# Patient Record
Sex: Female | Born: 1975 | Race: Black or African American | Hispanic: No | Marital: Single | Smoking: Never smoker
Health system: Southern US, Community
[De-identification: ages and names within clinical notes are randomized; demographics above are authoritative.]

## PROBLEM LIST (undated history)

## (undated) DIAGNOSIS — T7840XA Allergy, unspecified, initial encounter: Secondary | ICD-10-CM

## (undated) DIAGNOSIS — I10 Essential (primary) hypertension: Secondary | ICD-10-CM

## (undated) DIAGNOSIS — H409 Unspecified glaucoma: Secondary | ICD-10-CM

## (undated) HISTORY — DX: Allergy, unspecified, initial encounter: T78.40XA

## (undated) HISTORY — DX: Essential (primary) hypertension: I10

## (undated) HISTORY — DX: Unspecified glaucoma: H40.9

---

## 1998-02-10 ENCOUNTER — Other Ambulatory Visit: Admission: RE | Admit: 1998-02-10 | Discharge: 1998-02-10 | Payer: Self-pay | Admitting: Obstetrics and Gynecology

## 1999-06-18 ENCOUNTER — Other Ambulatory Visit: Admission: RE | Admit: 1999-06-18 | Discharge: 1999-06-18 | Payer: Self-pay | Admitting: Obstetrics and Gynecology

## 2000-07-19 ENCOUNTER — Other Ambulatory Visit: Admission: RE | Admit: 2000-07-19 | Discharge: 2000-07-19 | Payer: Self-pay | Admitting: Obstetrics and Gynecology

## 2001-08-05 ENCOUNTER — Emergency Department (HOSPITAL_COMMUNITY): Admission: EM | Admit: 2001-08-05 | Discharge: 2001-08-05 | Payer: Self-pay | Admitting: Emergency Medicine

## 2002-02-04 ENCOUNTER — Other Ambulatory Visit: Admission: RE | Admit: 2002-02-04 | Discharge: 2002-02-04 | Payer: Self-pay | Admitting: Obstetrics and Gynecology

## 2003-02-10 ENCOUNTER — Other Ambulatory Visit: Admission: RE | Admit: 2003-02-10 | Discharge: 2003-02-10 | Payer: Self-pay | Admitting: Obstetrics and Gynecology

## 2003-08-27 ENCOUNTER — Emergency Department (HOSPITAL_COMMUNITY): Admission: EM | Admit: 2003-08-27 | Discharge: 2003-08-27 | Payer: Self-pay | Admitting: Emergency Medicine

## 2004-03-05 ENCOUNTER — Ambulatory Visit (HOSPITAL_COMMUNITY): Admission: RE | Admit: 2004-03-05 | Discharge: 2004-03-05 | Payer: Self-pay | Admitting: General Surgery

## 2004-10-13 ENCOUNTER — Emergency Department (HOSPITAL_COMMUNITY): Admission: EM | Admit: 2004-10-13 | Discharge: 2004-10-13 | Payer: Self-pay | Admitting: Emergency Medicine

## 2004-12-18 ENCOUNTER — Emergency Department (HOSPITAL_COMMUNITY): Admission: EM | Admit: 2004-12-18 | Discharge: 2004-12-18 | Payer: Self-pay | Admitting: Emergency Medicine

## 2008-09-22 ENCOUNTER — Inpatient Hospital Stay (HOSPITAL_COMMUNITY): Admission: AD | Admit: 2008-09-22 | Discharge: 2008-09-22 | Payer: Self-pay | Admitting: Obstetrics

## 2008-11-12 ENCOUNTER — Ambulatory Visit (HOSPITAL_COMMUNITY): Admission: RE | Admit: 2008-11-12 | Discharge: 2008-11-12 | Payer: Self-pay | Admitting: Obstetrics & Gynecology

## 2008-11-20 ENCOUNTER — Ambulatory Visit (HOSPITAL_COMMUNITY): Admission: RE | Admit: 2008-11-20 | Discharge: 2008-11-20 | Payer: Self-pay | Admitting: Obstetrics & Gynecology

## 2008-12-23 ENCOUNTER — Ambulatory Visit (HOSPITAL_COMMUNITY): Admission: RE | Admit: 2008-12-23 | Discharge: 2008-12-23 | Payer: Self-pay | Admitting: Obstetrics & Gynecology

## 2009-01-16 ENCOUNTER — Inpatient Hospital Stay (HOSPITAL_COMMUNITY): Admission: AD | Admit: 2009-01-16 | Discharge: 2009-02-16 | Payer: Self-pay | Admitting: Obstetrics

## 2009-01-16 ENCOUNTER — Encounter: Payer: Self-pay | Admitting: Obstetrics & Gynecology

## 2009-01-21 ENCOUNTER — Encounter: Payer: Self-pay | Admitting: Obstetrics

## 2009-02-06 ENCOUNTER — Encounter: Payer: Self-pay | Admitting: Obstetrics

## 2009-02-13 ENCOUNTER — Encounter: Payer: Self-pay | Admitting: Obstetrics & Gynecology

## 2009-05-28 ENCOUNTER — Encounter (INDEPENDENT_AMBULATORY_CARE_PROVIDER_SITE_OTHER): Payer: Self-pay | Admitting: *Deleted

## 2009-06-19 ENCOUNTER — Ambulatory Visit: Payer: Self-pay | Admitting: Gastroenterology

## 2009-06-19 DIAGNOSIS — K625 Hemorrhage of anus and rectum: Secondary | ICD-10-CM | POA: Insufficient documentation

## 2009-06-19 DIAGNOSIS — I10 Essential (primary) hypertension: Secondary | ICD-10-CM | POA: Insufficient documentation

## 2009-07-03 ENCOUNTER — Ambulatory Visit: Payer: Self-pay | Admitting: Gastroenterology

## 2009-07-03 DIAGNOSIS — N39498 Other specified urinary incontinence: Secondary | ICD-10-CM | POA: Insufficient documentation

## 2010-05-17 ENCOUNTER — Encounter: Payer: Self-pay | Admitting: Obstetrics & Gynecology

## 2010-05-27 NOTE — Assessment & Plan Note (Signed)
Summary: diarrhea/blood in stool...as.   History of Present Illness Visit Type: consult Primary GI MD: Sheryn Bison MD FACP FAGA Primary Provider: Lajean Manes, MD Chief Complaint: bowel movements after eating that are sometimes loose, but painful. Pt will notice blood when she wipes.  Pt states she is also having urinary frequency and leaking History of Present Illness:   35 year old African American female 4 months post delivery by cesarean section who has daily bright red blood with wiping but no rectal or abdominal pain. She gives a vague history of previous anemia but is not on iron therapy at this time. She is on daily birth control pills. Family history noncontributory. She denies upper gastrointestinal or hepatobiliary problems. She follows a regular diet and denies lactose intolerance.   GI Review of Systems      Denies abdominal pain, acid reflux, belching, bloating, chest pain, dysphagia with liquids, dysphagia with solids, heartburn, loss of appetite, nausea, vomiting, vomiting blood, weight loss, and  weight gain.      Reports diarrhea, heme positive stool, and  rectal bleeding.     Denies anal fissure, black tarry stools, change in bowel habit, constipation, diverticulosis, fecal incontinence, hemorrhoids, irritable bowel syndrome, jaundice, light color stool, liver problems, and  rectal pain.    Current Medications (verified): 1)  Fruity Chewables Multivitamin  Chew (Pediatric Multiple Vit-C-Fa) .... Chew 2 Vitamins Daily 2)  Camila 0.35 Mg Tabs (Norethindrone) .... Take 1 Tablet By Mouth Once A Day  Allergies (verified): No Known Drug Allergies  Past History:  Past medical, surgical, family and social histories (including risk factors) reviewed for relevance to current acute and chronic problems.  Past Medical History: Hypertension Chronic Headaches  Past Surgical History: Reviewed history from 06/18/2009 and no changes required. C- section   Family  History: Reviewed history and no changes required. No FH of Colon Cancer:  Social History: Reviewed history and no changes required. Single, 1 girl Unemployed Patient has never smoked.  Alcohol Use - no Daily Caffeine Use 3/day Illicit Drug Use - no  Review of Systems       The patient complains of allergy/sinus, nosebleeds, sore throat, thirst - excessive, urination - excessive, urination changes/pain, and urine leakage.  The patient denies anemia, anxiety-new, arthritis/joint pain, back pain, blood in urine, breast changes/lumps, confusion, cough, coughing up blood, depression-new, fainting, fatigue, fever, headaches-new, hearing problems, heart murmur, heart rhythm changes, itching, menstrual pain, muscle pains/cramps, night sweats, pregnancy symptoms, shortness of breath, skin rash, sleeping problems, swelling of feet/legs, swollen lymph glands, vision changes, and voice change.    Vital Signs:  Patient profile:   35 year old female Height:      65 inches Weight:      198 pounds BMI:     33.07 Pulse rate:   64 / minute Pulse rhythm:   regular BP sitting:   130 / 86  (left arm) Cuff size:   regular  Vitals Entered By: Francee Piccolo CMA Duncan Dull) (June 19, 2009 8:25 AM)  Physical Exam  General:  Well developed, well nourished, no acute distress.healthy appearing.   Head:  Normocephalic and atraumatic. Eyes:  PERRLA, no icterus. Neck:  Supple; no masses or thyromegaly. Lungs:  Clear throughout to auscultation. Heart:  Regular rate and rhythm; no murmurs, rubs,  or bruits. Rectal:  Normal exam.hemocult negative.  impacted hard stool in rectal vault. No obviousfissure or fistula noted. Msk:  Symmetrical with no gross deformities. Normal posture. Extremities:  No clubbing, cyanosis, edema or deformities  noted. Neurologic:  Alert and  oriented x4;  grossly normal neurologically. Cervical Nodes:  No significant cervical adenopathy. Psych:  Alert and cooperative. Normal  mood and affect.   Impression & Recommendations:  Problem # 1:  RECTAL BLEEDING (ICD-569.3) Assessment Unchanged Presentation and examination consistent with mild constipation and internal hemorrhoidal bleeding. I placed her on daily Benefiber, high-fiber diet, liberal p.o. fluids, Anusol HC suppositories at bedtime, and we'll check her again in 2 weeks' time in followup. She may need colonoscopy depending on her clinical course.  Problem # 2:  HYPERTENSION, LABILE (ICD-401.9) Assessment: Improved Blood Pressure today is 130/86. She is not on any hypertensive therapy.  Patient Instructions: 1)  Copy sent to : Dr[facility] Coral Ceo at [facility]. 2)  Please continue current medications.  3)  Constipation and Hemorrhoids brochure given.  4)  Please schedule a follow-up appointment in 2 weeks.  5)  High Fiber, Low Fat  Healthy Eating Plan brochure given.  6)  daily Benefiber and liberal p.o. fluids 7)  The medication list was reviewed and reconciled.  All changed / newly prescribed medications were explained.  A complete medication list was provided to the patient / caregiver.  Appended Document: diarrhea/blood in stool...as.    Clinical Lists Changes  Medications: Added new medication of ANUSOL-HC 25 MG  SUPP (HYDROCORTISONE ACETATE) 1 per rectum q hs - Signed Added new medication of BENEFIBER  POWD (WHEAT DEXTRIN) Daily Rx of ANUSOL-HC 25 MG  SUPP (HYDROCORTISONE ACETATE) 1 per rectum q hs;  #24 x 3;  Signed;  Entered by: Ashok Cordia RN;  Authorized by: Mardella Layman MD [facility];  Method used: Electronically to [id number] [address] [place], [place]  [address], Ph: [phone] or [phone], Fax: [phone]    Prescriptions: ANUSOL-HC 25 MG  SUPP (HYDROCORTISONE ACETATE) 1 per rectum q hs  #24 x 3   Entered by:   Ashok Cordia RN   Authorized by:   Mardella Layman MD [facility]   Signed by:   Ashok Cordia RN on 06/19/2009   Method used:   Electronically to         [id number] (retail)       [address]       [place], [place]  [address]       Ph: [phone] or [phone]       Fax: [phone]   RxID:   [id number]

## 2010-05-27 NOTE — Letter (Signed)
Summary: New Patient letter  [facility]  [address] [place], [place] [address]   Phone: [phone]  Fax: [phone]       05/28/2009 MRN: [id number]  Lorraine Rodriguez [address] [place], [place]  [address]  Dear Lorraine Rodriguez,  Welcome to the Gastroenterology Division at [facility].    You are scheduled to see Dr.  Jarold Motto on 06/19/2009 at 8:30AM on the 3rd floor at [facility], [address]  We ask that you try to arrive at our office 15 minutes prior to your appointment time to allow for check-in.  We would like you to complete the enclosed self-administered evaluation form prior to your visit and bring it with you on the day of your appointment.  We will review it with you.  Also, please bring a complete list of all your medications or, if you prefer, bring the medication bottles and we will list them.  Please bring your insurance card so that we may make a copy of it.  If your insurance requires a referral to see a specialist, please bring your referral form from your primary care physician.  Co-payments are due at the time of your visit and may be paid by cash, check or credit card.     Your office visit will consist of a consult with your physician (includes a physical exam), any laboratory testing he/she may order, scheduling of any necessary diagnostic testing (e.g. x-ray, ultrasound, CT-scan), and scheduling of a procedure (e.g. Endoscopy, Colonoscopy) if required.  Please allow enough time on your schedule to allow for any/all of these possibilities.    If you cannot keep your appointment, please call [phone] to cancel or reschedule prior to your appointment date.  This allows [place] the opportunity to schedule an appointment for another patient in need of care.  If you do not cancel or reschedule by 5 p.m. the business day prior to your appointment date, you will be charged a $50.00 late cancellation/no-show fee.    Thank you for  choosing [facility] for your medical needs.  We appreciate the opportunity to care for you.  Please visit [place] at our website  to learn more about our practice.                     Sincerely,                                                             The Gastroenterology Division

## 2010-05-27 NOTE — Assessment & Plan Note (Signed)
Summary: 2-WEEK F/U APPT...LSW.   History of Present Illness Visit Type: Follow-up Visit Primary GI MD: Sheryn Bison MD FACP FAGA Primary Provider: Lajean Manes, MD Requesting Provider: n/a Chief Complaint: F/u for loose stools and blood when pt wipes after BMs. Pt states the stools have gotten better and there is no more  blood. Pt still having urine leakage  History of Present Illness:   She currently is asymptomatic except for some gas and bloating. Her constipation is improved with Metamucil wafers. She denies other GI complaints. She does have some mild urinary incontinency that has not been evaluated to date.   GI Review of Systems      Denies abdominal pain, acid reflux, belching, bloating, chest pain, dysphagia with liquids, dysphagia with solids, heartburn, loss of appetite, nausea, vomiting, vomiting blood, weight loss, and  weight gain.        Denies anal fissure, black tarry stools, change in bowel habit, constipation, diarrhea, diverticulosis, fecal incontinence, heme positive stool, hemorrhoids, irritable bowel syndrome, jaundice, light color stool, liver problems, rectal bleeding, and  rectal pain.        Current Medications (verified): 1)  Fruity Chewables Multivitamin  Chew (Pediatric Multiple Vit-C-Fa) .... Chew 2 Vitamins Daily 2)  Camila 0.35 Mg Tabs (Norethindrone) .... Take 1 Tablet By Mouth Once A Day 3)  Anusol-Hc 25 Mg  Supp (Hydrocortisone Acetate) .Marland Kitchen.. 1 Per Rectum Q Hs 4)  Metamucil  Wafr (Psyllium) .... Two Daily  Allergies (verified): No Known Drug Allergies  Past History:  Past medical, surgical, family and social histories (including risk factors) reviewed for relevance to current acute and chronic problems.  Past Medical History: Reviewed history from 06/19/2009 and no changes required. Hypertension Chronic Headaches  Past Surgical History: Reviewed history from 06/18/2009 and no changes required. C- section  Family  History: Reviewed history from 06/19/2009 and no changes required. No FH of Colon Cancer:  Social History: Reviewed history from 06/19/2009 and no changes required. Single, 1 girl Unemployed Patient has never smoked.  Alcohol Use - no Daily Caffeine Use 3/day Illicit Drug Use - no  Review of Systems       The patient complains of urine leakage.  The patient denies allergy/sinus, anemia, anxiety-new, arthritis/joint pain, back pain, blood in urine, breast changes/lumps, change in vision, confusion, cough, coughing up blood, depression-new, fainting, fatigue, fever, headaches-new, hearing problems, heart murmur, heart rhythm changes, itching, menstrual pain, muscle pains/cramps, night sweats, nosebleeds, pregnancy symptoms, shortness of breath, skin rash, sleeping problems, sore throat, swelling of feet/legs, swollen lymph glands, thirst - excessive , urination - excessive , urination changes/pain, vision changes, and voice change.   GU:  Complains of urinary incontinence; denies urinary burning, blood in urine, nocturnal urination, urinary frequency, abnormal vaginal bleeding, amenorrhea, menorrhagia, vaginal discharge, pelvic pain, genital sores, painful intercourse, and decreased libido.  Vital Signs:  Patient profile:   35 year old female Height:      65 inches Weight:      200 pounds BMI:     33.40 BSA:     1.98 Pulse rate:   62 / minute Pulse rhythm:   regular BP sitting:   124 / 76  (left arm) Cuff size:   regular  Vitals Entered By: Ok Anis CMA (July 03, 2009 9:00 AM)  Physical Exam  General:  Well developed, well nourished, no acute distress.healthy appearing.   Head:  Normocephalic and atraumatic. Eyes:  PERRLA, no icterus.exam deferred to patient's ophthalmologist.   Psych:  Alert  and cooperative. Normal mood and affect.   Impression & Recommendations:  Problem # 1:  RECTAL BLEEDING (ICD-569.3) Assessment Improved Continue constipation regime I will have her  try [employer]' mColon Health b.i.d. as tolerated. Should rectal bleeding return,she is to notify us and we'll proceed with colonoscopy exam.  Problem # 2:  OTHER URINARY INCONTINENCE (ICD-788.39) Assessment: Unchanged She has been advised to notify her gynecologist for appropriate followup and perhaps urologic referral.  Patient Instructions: 1)  Copy sent to : Dr. Lajean Manes and Dr. Harrell GaveChristell Constant in gynecology 2)  Please continue current medications.  3)  Constipation and Hemorrhoids brochure given.  4)  Please schedule a follow-up appointment as needed.  5)  The medication list was reviewed and reconciled.  All changed / newly prescribed medications were explained.  A complete medication list was provided to the patient / caregiver.  Appended Document: 2-WEEK F/U APPT...LSW.    Clinical Lists Changes  Medications: Added new medication of PHILLIPS COLON HEALTH  CAPS (PROBIOTIC PRODUCT) two times a day

## 2010-07-29 LAB — COMPREHENSIVE METABOLIC PANEL
AST: 20 U/L (ref 0–37)
Albumin: 2.8 g/dL — ABNORMAL LOW (ref 3.5–5.2)
Alkaline Phosphatase: 76 U/L (ref 39–117)
BUN: 8 mg/dL (ref 6–23)
Calcium: 9.2 mg/dL (ref 8.4–10.5)
Creatinine, Ser: 0.73 mg/dL (ref 0.4–1.2)
GFR calc non Af Amer: >60 mL/min (ref >60)
Glucose, Bld: 89 mg/dL (ref 70–99)
Potassium: 3.8 mEq/L (ref 3.5–5.1)
Total Protein: 6.6 g/dL (ref 6.0–8.3)

## 2010-07-29 LAB — URINE CULTURE
Colony Count: >=100000
Special Requests: NEGATIVE

## 2010-07-29 LAB — URINALYSIS, ROUTINE W REFLEX MICROSCOPIC
Glucose, UA: NEGATIVE mg/dL
Ketones, ur: NEGATIVE mg/dL
Protein, ur: 30 mg/dL — AB

## 2010-07-29 LAB — CBC
Hemoglobin: 12.7 g/dL (ref 12.0–15.0)
Hemoglobin: 13.1 g/dL (ref 12.0–15.0)
MCHC: 32.9 g/dL (ref 30.0–36.0)
MCHC: 33.4 g/dL (ref 30.0–36.0)
MCV: 88.9 fL (ref 78.0–100.0)
MCV: 89 fL (ref 78.0–100.0)
MCV: 89.3 fL (ref 78.0–100.0)
Platelets: 222 10*3/uL (ref 150–400)
Platelets: 267 10*3/uL (ref 150–400)
RBC: 4.03 MIL/uL (ref 3.87–5.11)
RBC: 4.34 MIL/uL (ref 3.87–5.11)
RDW: 15.1 % (ref 11.5–15.5)
RDW: 15.4 % (ref 11.5–15.5)
RDW: 15.4 % (ref 11.5–15.5)
WBC: 12.5 10*3/uL — ABNORMAL HIGH (ref 4.0–10.5)

## 2010-07-29 LAB — TYPE AND SCREEN
ABO/RH(D): A POS
Antibody Screen: NEGATIVE

## 2010-07-29 LAB — CREATININE CLEARANCE, URINE, 24 HOUR
Collection Interval-CRCL: 24 hours
Creatinine, 24H Ur: 1835 mg/d — ABNORMAL HIGH (ref 700–1800)
Creatinine: 0.73 mg/dL (ref 0.40–1.20)

## 2010-07-29 LAB — PROTEIN, URINE, 24 HOUR
Protein, 24H Urine: 146 mg/d — ABNORMAL HIGH (ref 50–100)
Protein, Urine: 11 mg/dL
Urine Total Volume-UPROT: 1325 mL

## 2010-07-29 LAB — BASIC METABOLIC PANEL
CO2: 22 mEq/L (ref 19–32)
Calcium: 8.4 mg/dL (ref 8.4–10.5)
Creatinine, Ser: 0.97 mg/dL (ref 0.4–1.2)
GFR calc Af Amer: >60 mL/min (ref >60)
GFR calc non Af Amer: >60 mL/min (ref >60)

## 2010-07-29 LAB — TSH: TSH: 0.842 u[IU]/mL (ref 0.350–4.500)

## 2010-07-29 LAB — GLUCOSE TOLERANCE, 1 HOUR: Glucose, 1 Hour GTT: 98 mg/dL (ref 70–140)

## 2010-07-29 LAB — URINE MICROSCOPIC-ADD ON

## 2010-07-29 LAB — CLOSTRIDIUM DIFFICILE EIA

## 2010-07-30 LAB — COMPREHENSIVE METABOLIC PANEL WITH GFR
ALT: 21 U/L (ref 0–35)
AST: 62 U/L — ABNORMAL HIGH (ref 0–37)
Albumin: 3.3 g/dL — ABNORMAL LOW (ref 3.5–5.2)
Alkaline Phosphatase: 65 U/L (ref 39–117)
BUN: 7 mg/dL (ref 6–23)
CO2: 23 meq/L (ref 19–32)
Calcium: 9.7 mg/dL (ref 8.4–10.5)
Chloride: 103 meq/L (ref 96–112)
Creatinine, Ser: 0.76 mg/dL (ref 0.4–1.2)
GFR calc non Af Amer: >60 mL/min
Glucose, Bld: 102 mg/dL — ABNORMAL HIGH (ref 70–99)
Potassium: 5.7 meq/L — ABNORMAL HIGH (ref 3.5–5.1)
Sodium: 133 meq/L — ABNORMAL LOW (ref 135–145)
Total Bilirubin: 1.4 mg/dL — ABNORMAL HIGH (ref 0.3–1.2)
Total Protein: 7 g/dL (ref 6.0–8.3)

## 2010-07-30 LAB — CBC
HCT: 41 % (ref 36.0–46.0)
Hemoglobin: 13.6 g/dL (ref 12.0–15.0)
WBC: 9.4 10*3/uL (ref 4.0–10.5)

## 2010-07-30 LAB — DIFFERENTIAL
Basophils Absolute: 0 10*3/uL (ref 0.0–0.1)
Basophils Relative: 1 % (ref 0–1)
Monocytes Absolute: 0.7 10*3/uL (ref 0.1–1.0)
Neutro Abs: 7 10*3/uL (ref 1.7–7.7)
Neutrophils Relative %: 74 % (ref 43–77)

## 2011-04-28 ENCOUNTER — Other Ambulatory Visit: Payer: Self-pay | Admitting: Obstetrics & Gynecology

## 2012-03-21 ENCOUNTER — Other Ambulatory Visit (HOSPITAL_COMMUNITY): Payer: Self-pay | Admitting: Obstetrics

## 2013-05-25 ENCOUNTER — Ambulatory Visit (INDEPENDENT_AMBULATORY_CARE_PROVIDER_SITE_OTHER): Payer: No Typology Code available for payment source | Admitting: Internal Medicine

## 2013-05-25 VITALS — BP 120/70 | HR 76 | Temp 98.1°F | Resp 16 | Ht 65.25 in | Wt 185.2 lb

## 2013-05-25 DIAGNOSIS — K141 Geographic tongue: Secondary | ICD-10-CM

## 2013-05-25 NOTE — Progress Notes (Signed)
   Subjective:    Patient ID: Lorraine Rodriguez, female    DOB: [birth year], 38 y.o.   MRN: [id number] This chart was scribed for Ellamae Siaobert Kai Calico, MD by Nicholos Johnsenise Iheanachor, Medical Scribe. This patient's care was started at 3:23 PM.  HPI HPI Comments: Lorraine Rodriguez is a 38 y.o. female who presents to the [facility] complaining of bumps on her tonque. Pt states she had pizza with olive toppings from [employer] 10 days ago and the next morning her tongue had developed some bumps in the back near the throat. There is no pain associated with these bumps. States bumps have gone down some but are still present. Pt denies any other sx that developed as a result of eating the pizza and states she has never had this develop before. Pt went to the [facility] but was unable to receive a diagnosis. Denies problems with taste, trouble swallowing, or sore throat. No Fever or nodes.  Review of Systems Noncontributory    Objective:   Physical Exam  Vitals reviewed. Constitutional: She is oriented to person, place, and time. She appears well-developed and well-nourished. No distress.  HENT:  Head: Normocephalic and atraumatic.  Mouth/Throat: Oropharynx is clear and moist.  On the posterior aspect of the tongue there are vallate papillae are prominent without any inflammation or fasciculation The soft and hard palates are clear The posterior pharynx is clear No other oral lesions are present  Eyes: Conjunctivae are normal. Pupils are equal, round, and reactive to light.  Neck: Neck supple.  Lymphadenopathy:    She has no cervical adenopathy.  Neurological: She is alert and oriented to person, place, and time.  Psychiatric: She has a normal mood and affect.   Assessment & Plan:  Pt is advised to just wait out the sx and let them resolve itself.   I have completed the patient encounter in its entirety as documented by the scribe, with editing by me where necessary. Demyah Smyre P. Merla Richesoolittle,  M.D. Geographic tongue  Meds=none needed These papillae should enlarge and decrease in relationship to spicy or salty food intake and should never be associated with any sort of pain or taste dysfunction in their current state

## 2014-11-05 ENCOUNTER — Ambulatory Visit: Payer: Worker's Compensation

## 2014-11-05 ENCOUNTER — Ambulatory Visit (INDEPENDENT_AMBULATORY_CARE_PROVIDER_SITE_OTHER): Payer: Worker's Compensation | Admitting: Emergency Medicine

## 2014-11-05 VITALS — BP 112/78 | HR 84 | Temp 98.5°F | Resp 16 | Ht 65.5 in | Wt 188.8 lb

## 2014-11-05 DIAGNOSIS — S0993XA Unspecified injury of face, initial encounter: Secondary | ICD-10-CM | POA: Diagnosis not present

## 2014-11-05 DIAGNOSIS — M542 Cervicalgia: Secondary | ICD-10-CM

## 2014-11-05 DIAGNOSIS — R519 Headache, unspecified: Secondary | ICD-10-CM

## 2014-11-05 DIAGNOSIS — R51 Headache: Secondary | ICD-10-CM | POA: Diagnosis not present

## 2014-11-05 DIAGNOSIS — N946 Dysmenorrhea, unspecified: Secondary | ICD-10-CM

## 2014-11-05 LAB — POCT URINE PREGNANCY: Preg Test, Ur: NEGATIVE

## 2014-11-05 IMAGING — CR DG FACIAL BONES COMPLETE 3+V
3 series · 3 of 3 positions shown · non-contrast
Comparison: None.

CLINICAL DATA: Recently hit in face with basketball with
right-sided facial pain

EXAM:
FACIAL BONES COMPLETE 3+V

[pa [person_name]]
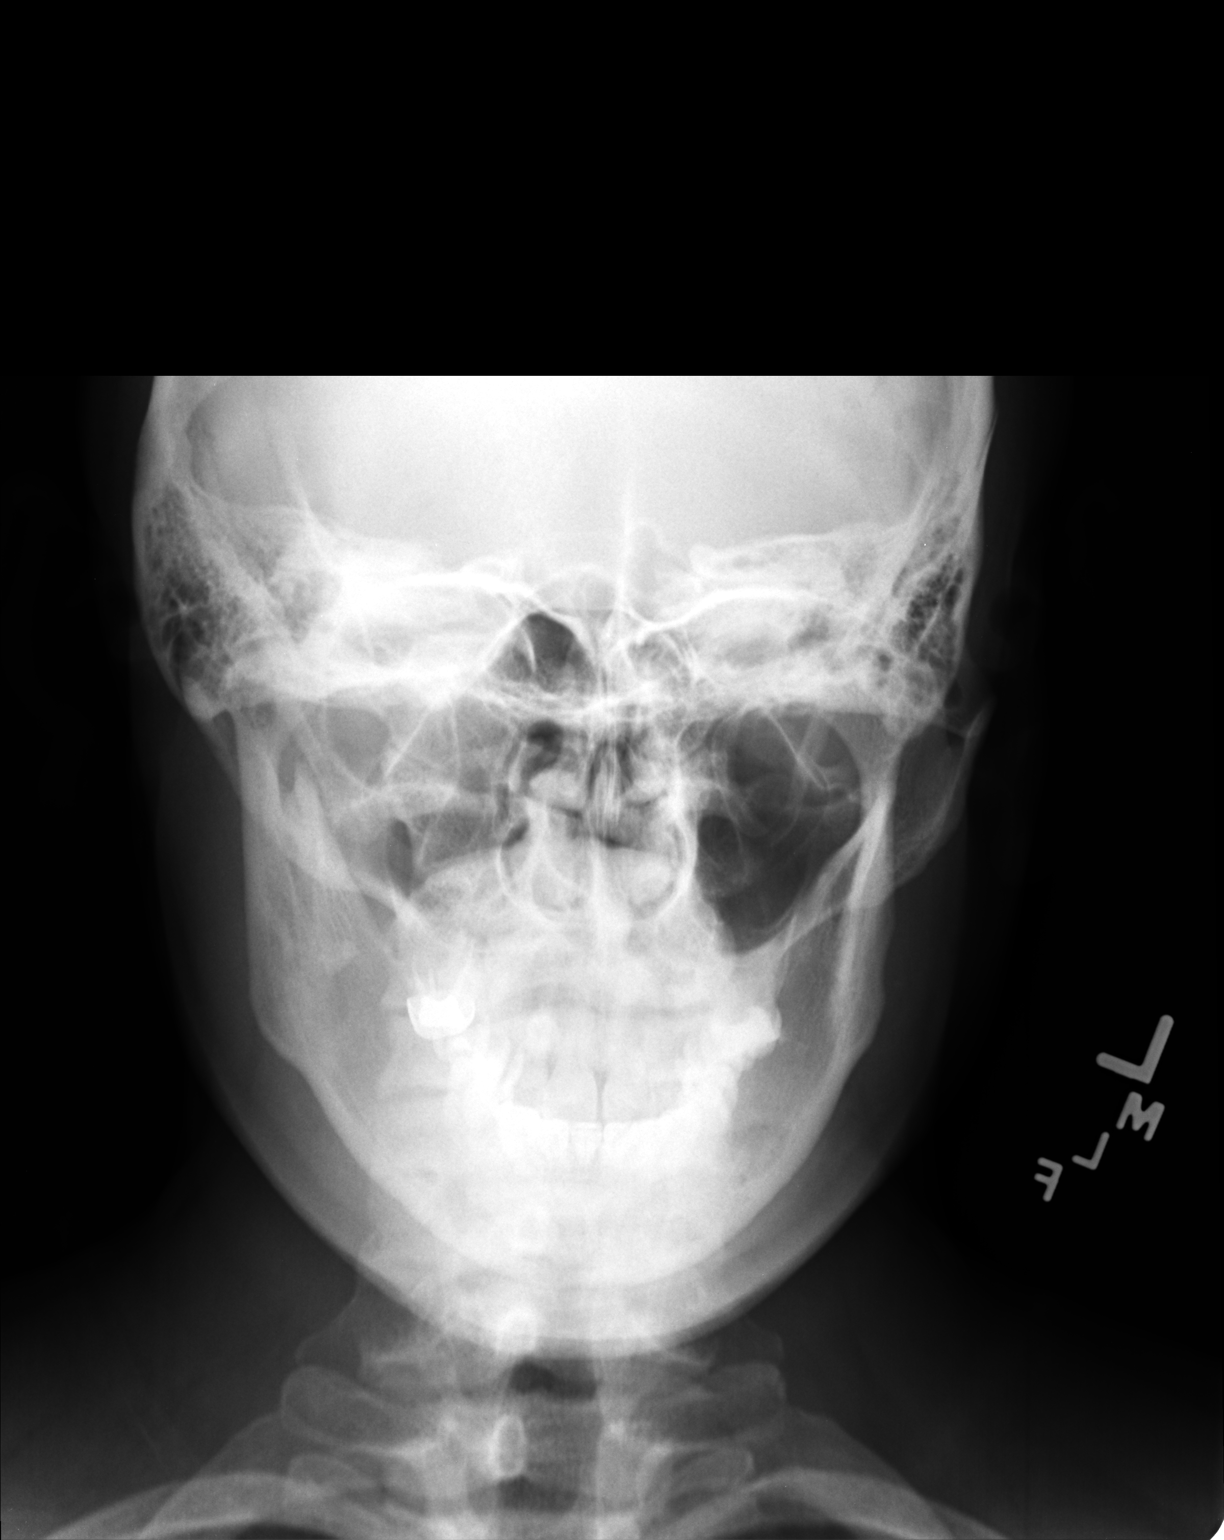

[waters]
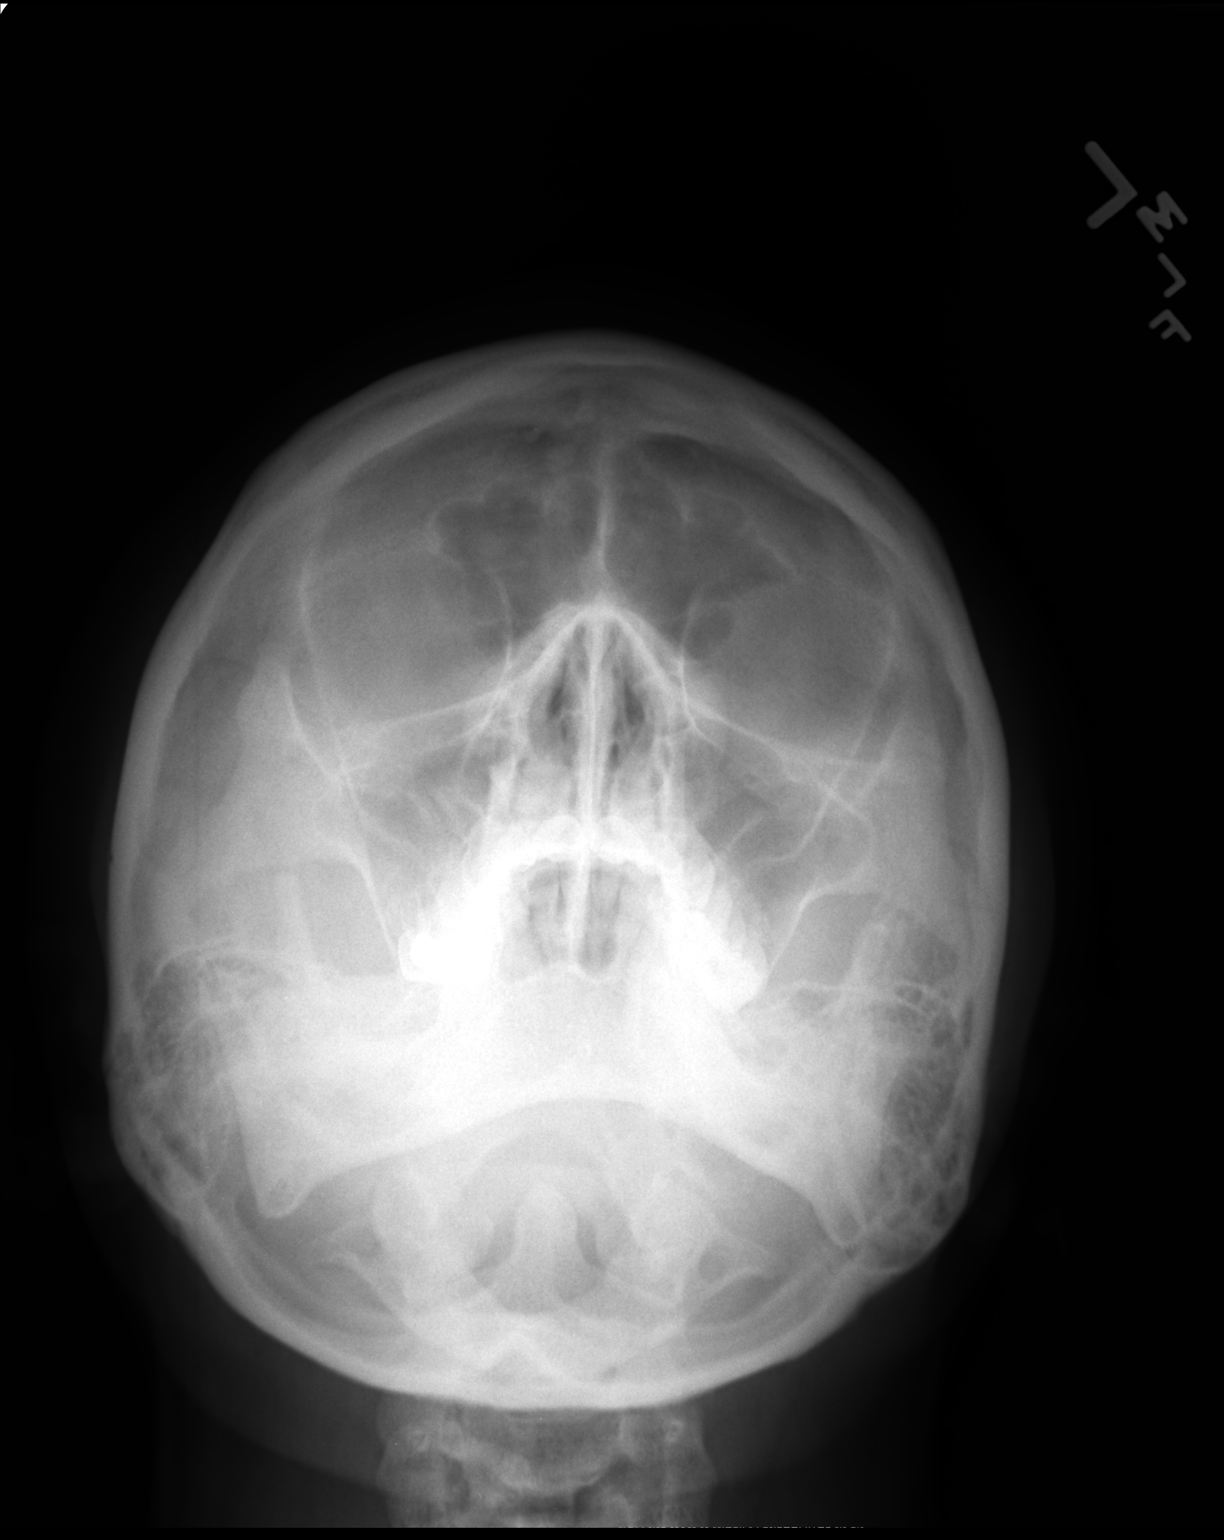

[lateral]
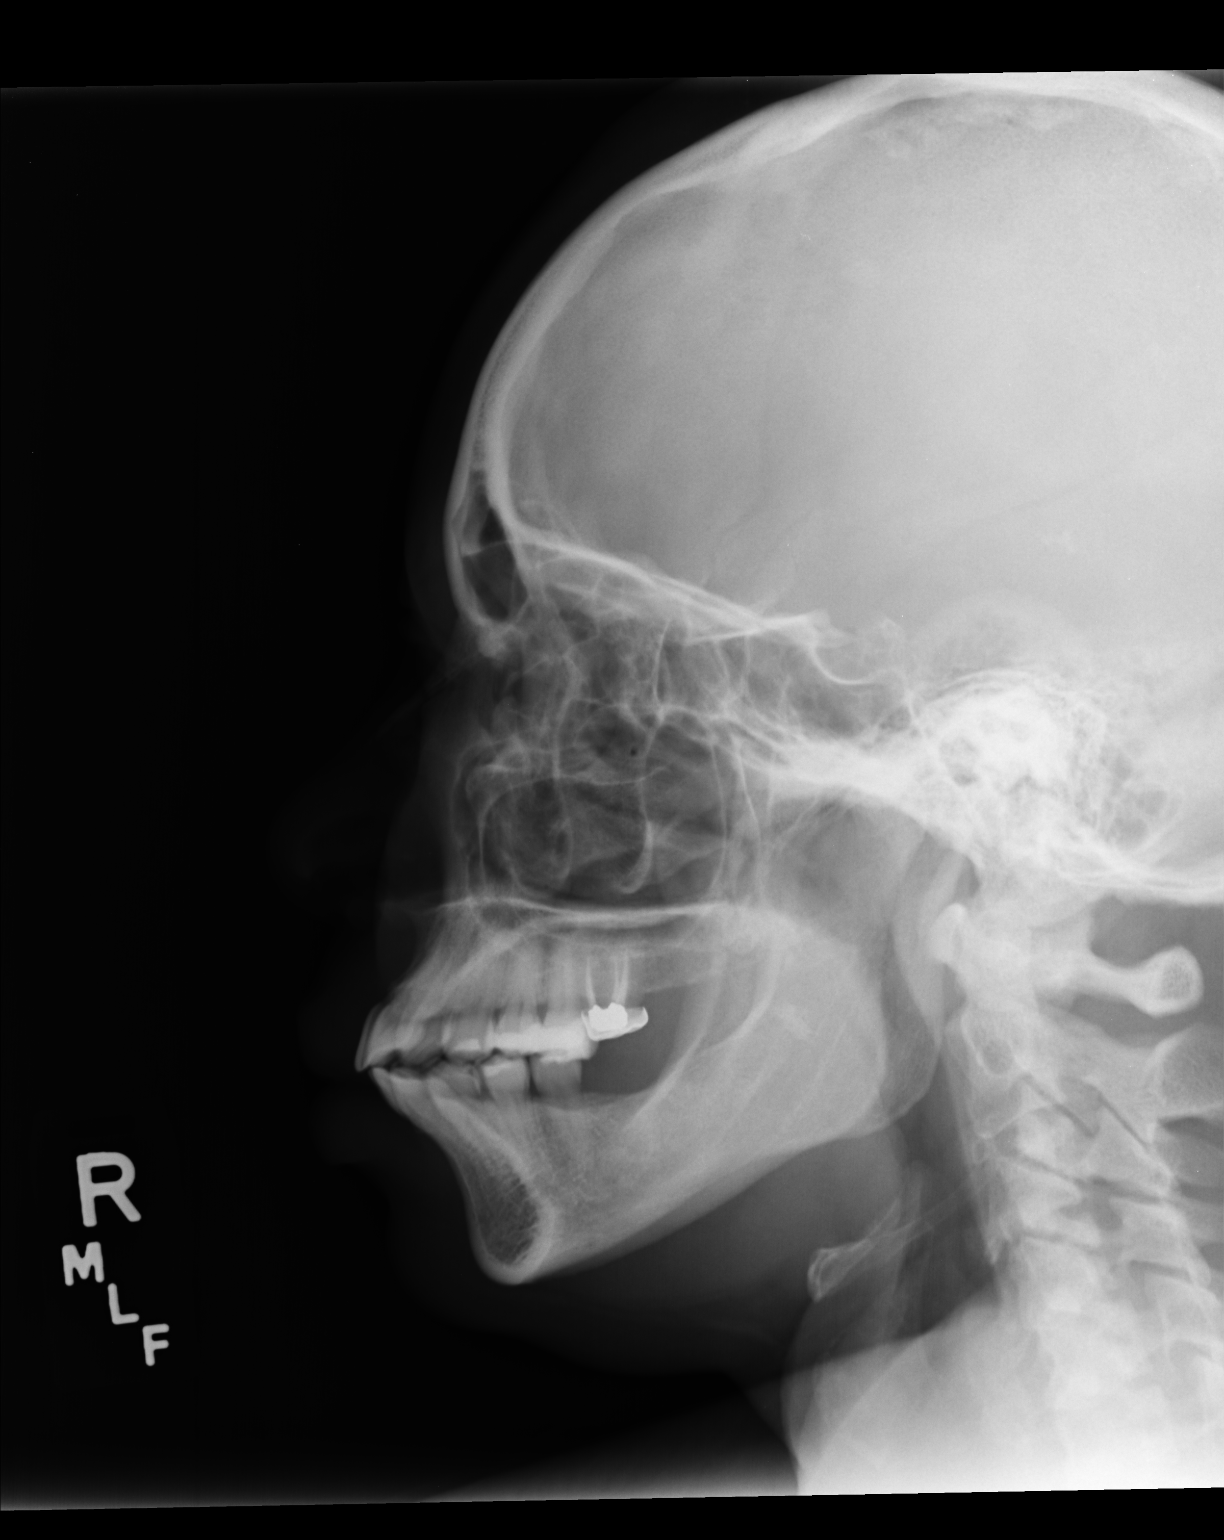

[3 of 3 positions shown; findings below may reference images not displayed]

FINDINGS: There is no evidence of fracture or other significant bone
abnormality. No orbital emphysema or sinus air-fluid levels are
seen.
IMPRESSION: No acute abnormality noted.

## 2014-11-05 MED ORDER — IBUPROFEN 600 MG PO TABS: 600.0000 mg | ORAL_TABLET | Freq: Three times a day (TID) | ORAL | Status: AC | PRN

## 2014-11-05 NOTE — Patient Instructions (Signed)
  GET HELP IF:  It gets even harder for you to pay attention or concentrate.  It gets even harder for you to remember things or learn new things.  You need more time than normal to finish things.  You become annoyed (irritable) more than before.  You are not able to deal with stress as well.  You have more problems than before.  You have problems keeping your balance.  You are not able to react quickly when you should. Get help if you have any of these problems for more than 2 weeks:   Lasting (chronic) headaches.  Dizziness or trouble balancing.  Feeling sick to your stomach (nausea).  Seeing (vision) problems.  Being affected by noises or light more than normal.  Feeling sad, low, down in the dumps, blue, gloomy, or empty (depressed).  Mood changes (mood swings).  Feeling of fear or nervousness about what may happen (anxiety).  Feeling annoyed.  Memory problems.  Problems concentrating or paying attention.  Sleep problems.  Feeling tired all the time. GET HELP RIGHT AWAY IF:   You have bad headaches or your headaches get worse.  You have weakness (even if it is in one hand, leg, or part of the face).  You have loss of feeling (numbness).  You feel off balance.  You keep throwing up (vomiting).  You feel tired.  One black center of your eye (pupil) is larger than the other.  You twitch or shake violently (convulse).  Your speech is not clear (slurred).  You are more confused, easily angered (agitated), or annoyed than before.  You have more trouble resting than before.  You are unable to recognize people or places.  You have neck pain.  It is difficult to wake you up.  You have unusual behavior changes.  You pass out (lose consciousness). MAKE SURE YOU:   Understand these instructions.  Will watch your condition.  Will get help right away if you are not doing well or get worse. Document Released: 03/30/2009 Document Revised:  08/26/2013 Document Reviewed: 11/01/2012 [facility] Patient Information 2015 [place]. This information is not intended to replace advice given to you by your health care provider. Make sure you discuss any questions you have with your health care provider.

## 2014-11-05 NOTE — Progress Notes (Signed)
   Subjective:    Patient ID: Lorraine Rodriguez, female    DOB: [birth year], 39 y.o.   MRN: [id number]  HPI    Review of Systems     Objective:   Physical Exam        Assessment & Plan:

## 2014-11-05 NOTE — Progress Notes (Signed)
Subjective:    Patient ID: Lorraine Rodriguez, female    DOB: [birth year], 39 y.o.   MRN: [id number]  HPI Worker's comp patient presents for right-sided facial, head, neck, arm, and torso pain that started last night following being hit in the head with a basketball. Initially only had pain at impact sight, but now has 6-7/10 pain on noted sites. Denies numbness, tingling, weakness, change in vision, difficulty swallowing, tinnitus, decrease hearing, or gait change. Has taken Bayer with some relief. NKDA.   Review of Systems  Constitutional: Negative.   Eyes: Negative for photophobia and visual disturbance.  Cardiovascular: Negative for chest pain.  Gastrointestinal: Negative for nausea, vomiting and abdominal pain.  Musculoskeletal: Positive for neck pain. Negative for myalgias, back pain, joint swelling, arthralgias, gait problem and neck stiffness.  Skin: Negative.   Neurological: Positive for dizziness (resolved) and headaches. Negative for weakness and numbness.       Objective:   Physical Exam  Constitutional: She is oriented to person, place, and time. She appears well-developed and well-nourished. No distress.  Blood pressure 112/78, pulse 84, temperature 98.5 F (36.9 C), temperature source Oral, resp. rate 16, height 5' 5.5" (1.664 m), weight 188 lb 12.8 oz (85.639 kg), last menstrual period 09/29/2014, SpO2 98 %.  HENT:  Head: Normocephalic and atraumatic.  Right Ear: Tympanic membrane, external ear and ear canal normal.  Left Ear: Tympanic membrane, external ear and ear canal normal.  Mouth/Throat: Uvula is midline.  Eyes: Conjunctivae and EOM are normal. Pupils are equal, round, and reactive to light. Right eye exhibits no discharge. Left eye exhibits no discharge. No scleral icterus.  Neck: Normal range of motion and full passive range of motion without pain. Neck supple. No spinous process tenderness and no muscular tenderness present. No rigidity. No edema, no erythema and  normal range of motion present.  Cardiovascular: Normal rate, regular rhythm and normal heart sounds.  Exam reveals no gallop and no friction rub.   No murmur heard. Pulmonary/Chest: Effort normal and breath sounds normal. No respiratory distress. She has no wheezes. She has no rales.  Musculoskeletal: Normal range of motion. She exhibits no edema or tenderness.       Right shoulder: She exhibits pain. She exhibits normal range of motion, no tenderness, no bony tenderness, no swelling, no effusion, no crepitus, no deformity, no laceration, no spasm, normal pulse and normal strength.       Left shoulder: Normal.       Cervical back: She exhibits pain. She exhibits normal range of motion, no tenderness, no bony tenderness, no swelling, no edema, no deformity, no laceration, no spasm and normal pulse.       Thoracic back: Normal.       Lumbar back: Normal.  Neurological: She is alert and oriented to person, place, and time. She has normal reflexes. No cranial nerve deficit. She exhibits normal muscle tone. Coordination normal.  Skin: Skin is warm and dry. No rash noted. She is not diaphoretic. No erythema. No pallor.  Psychiatric: She has a normal mood and affect. Her behavior is normal. Judgment and thought content normal.   [facility] reading (PRIMARY) by  Dr. Dareen PianoAnderson. No acute bony abnormalities.    Assessment & Plan:  1. Facial injury, initial encounter 2. Facial pain PE and Xrays benign. Cleared to return to work. - DG Facial Bones Complete; Future - DG Cervical Spine 2 or 3 views; Future - ibuprofen (ADVIL,MOTRIN) 600 MG tablet; Take 1 tablet (600 mg total)  by mouth every 8 (eight) hours as needed.  Dispense: 30 tablet; Refill: 0  3. Dysmenorrhea Ok to have Xray. - POCT urine pregnancy   Janan Ridge PA-C  [facility] 11/05/2014 6:15 PM

## 2014-11-11 NOTE — Progress Notes (Signed)
  Medical screening examination/treatment/procedure(s) were performed by non-physician practitioner and as supervising physician I was immediately available for consultation/collaboration.     

## 2016-04-07 ENCOUNTER — Emergency Department (HOSPITAL_COMMUNITY)
Admission: EM | Admit: 2016-04-07 | Discharge: 2016-04-07 | Disposition: A | Discharge status: Home / Self Care | Payer: Self-pay | Attending: Emergency Medicine | Admitting: Emergency Medicine

## 2016-04-07 ENCOUNTER — Encounter (HOSPITAL_COMMUNITY): Payer: Self-pay | Admitting: Emergency Medicine

## 2016-04-07 DIAGNOSIS — Z91010 Allergy to peanuts: Secondary | ICD-10-CM | POA: Insufficient documentation

## 2016-04-07 DIAGNOSIS — Z79899 Other long term (current) drug therapy: Secondary | ICD-10-CM | POA: Insufficient documentation

## 2016-04-07 DIAGNOSIS — N926 Irregular menstruation, unspecified: Secondary | ICD-10-CM | POA: Insufficient documentation

## 2016-04-07 DIAGNOSIS — I10 Essential (primary) hypertension: Secondary | ICD-10-CM | POA: Insufficient documentation

## 2016-04-07 DIAGNOSIS — N939 Abnormal uterine and vaginal bleeding, unspecified: Secondary | ICD-10-CM

## 2016-04-07 LAB — CBC WITH DIFFERENTIAL/PLATELET
BASOS ABS: 0 10*3/uL (ref 0.0–0.1)
BASOS PCT: 0 %
EOS ABS: 0.2 10*3/uL (ref 0.0–0.7)
EOS PCT: 5 %
HCT: 39.4 % (ref 36.0–46.0)
Hemoglobin: 12.9 g/dL (ref 12.0–15.0)
Lymphocytes Relative: 44 %
Lymphs Abs: 2.2 10*3/uL (ref 0.7–4.0)
MCH: 26.5 pg (ref 26.0–34.0)
MCHC: 32.7 g/dL (ref 30.0–36.0)
MCV: 81.1 fL (ref 78.0–100.0)
Monocytes Absolute: 0.3 10*3/uL (ref 0.1–1.0)
Monocytes Relative: 6 %
Neutro Abs: 2.2 10*3/uL (ref 1.7–7.7)
Neutrophils Relative %: 45 %
PLATELETS: 353 10*3/uL (ref 150–400)
RBC: 4.86 MIL/uL (ref 3.87–5.11)
RDW: 16 % — AB (ref 11.5–15.5)
WBC: 4.9 10*3/uL (ref 4.0–10.5)

## 2016-04-07 LAB — URINALYSIS, ROUTINE W REFLEX MICROSCOPIC
Bilirubin Urine: NEGATIVE
Glucose, UA: NEGATIVE mg/dL
Ketones, ur: NEGATIVE mg/dL
Nitrite: NEGATIVE
PH: 5 (ref 5.0–8.0)
Protein, ur: 30 mg/dL — AB
SPECIFIC GRAVITY, URINE: 1.023 (ref 1.005–1.030)

## 2016-04-07 LAB — BASIC METABOLIC PANEL
ANION GAP: 5 (ref 5–15)
BUN: 14 mg/dL (ref 6–20)
CALCIUM: 9.2 mg/dL (ref 8.9–10.3)
CO2: 26 mmol/L (ref 22–32)
Chloride: 107 mmol/L (ref 101–111)
Creatinine, Ser: 0.98 mg/dL (ref 0.44–1.00)
GFR calc Af Amer: >60 mL/min (ref >60)
Glucose, Bld: 90 mg/dL (ref 65–99)
POTASSIUM: 3.8 mmol/L (ref 3.5–5.1)
SODIUM: 138 mmol/L (ref 135–145)

## 2016-04-07 LAB — I-STAT BETA HCG BLOOD, ED (MC, WL, AP ONLY)

## 2016-04-07 MED ORDER — GI COCKTAIL ~~LOC~~
30.0000 mL | Freq: Once | ORAL | Status: AC
  Administered 2016-04-07: 30 mL via ORAL
  Filled 2016-04-07: qty 30

## 2016-04-07 NOTE — ED Triage Notes (Signed)
Pt. reports vaginal spotting onset this evening , denies injury , no dysuria or abdominal pain .

## 2016-04-07 NOTE — ED Notes (Addendum)
Pt was going to OBGYN on this upcoming Monday for a pregnancy test. Pt was home this evening, used the restroom and upon wiping noted a small amount of blood. Pt reports that her menstrual cycle usually comes about during this time of the month.

## 2016-04-07 NOTE — Discharge Instructions (Signed)
Your vaginal bleeding is likely due to a normal period starting. If you experience severely worsening bleeding, abdominal pain or other new worsening symptoms return to the ED.

## 2016-04-07 NOTE — ED Provider Notes (Signed)
[facility]-EMER[facility]ENCY DEPT Provider Note   CSN: [id number] Arrival date & time: 04/07/16  1922     History   Chief Complaint Chief Complaint  Patient presents with  . Vaginal Bleeding    HPI Lorraine Rodriguez is a 40 y.o. female.  HPI Patient is a 40 year old female with past medical history of hypertension who presents for evaluation of vaginal bleeding. Patient reports that she has been trying to get pregnant with 1 female partner. Her last menstrual period was around November 4-7. Over the past 2 weeks she has been experiencing some epigastric discomfort, and her normal menses are 3 weeks late, so she thought that she might be pregnant. Today, patient noted some vaginal spotting with wiping after urination. She subsequently began to have mild vaginal bleeding, less than a period. Denies lower abdominal pain or cramping, vaginal discharge, or vaginal pain/itching. No nausea or vomiting. No dizziness, lightheadedness or shortness of breath.  Past Medical History:  Diagnosis Date  . Allergy    hayfever  . Glaucoma    both eyes  . Hypertension     Patient Active Problem List   Diagnosis Date Noted  . OTHER URINARY INCONTINENCE 07/03/2009  . HYPERTENSION, LABILE 06/19/2009  . RECTAL BLEEDING 06/19/2009    Past Surgical History:  Procedure Laterality Date  . CESAREAN SECTION  2010    OB History    No data available       Home Medications    Prior to Admission medications   Medication Sig Start Date End Date Taking? Authorizing Provider  amLODipine (NORVASC) 5 MG tablet Take 5 mg by mouth daily.    Historical Provider, MD  calcium & magnesium carbonates (MYLANTA) 311-232 MG per tablet Take 1 tablet by mouth daily.    Historical Provider, MD  famotidine (PEPCID) 20 MG tablet Take 20 mg by mouth 2 (two) times daily.    Historical Provider, MD  fexofenadine (ALLEGRA) 180 MG tablet Take 180 mg by mouth daily.    Historical Provider, MD  ibuprofen (ADVIL,MOTRIN) 600 MG tablet Take  1 tablet (600 mg total) by mouth every 8 (eight) hours as needed. 11/05/14   Tishira R Brewington, PA-C  MULTIPLE VITAMIN PO Take by mouth daily.    Historical Provider, MD  sulfamethoxazole-trimethoprim (BACTRIM DS,SEPTRA DS) 800-160 MG per tablet Take 1 tablet by mouth 2 (two) times daily.    Historical Provider, MD    Family History Family History  Problem Relation Age of Onset  . Diabetes Mother   . Hypertension Mother   . Hypertension Father   . Glaucoma Maternal Grandfather   . Heart disease Maternal Grandfather     congential heart failure  . Kidney failure Paternal Grandmother   . Alzheimer's disease Paternal Grandfather     Social History Social History  Substance Use Topics  . Smoking status: Never Smoker  . Smokeless tobacco: Never Used  . Alcohol use No     Allergies   Peanuts [peanut oil]   Review of Systems Review of Systems  Constitutional: Negative for chills and fever.  HENT: Negative for ear pain and sore throat.   Eyes: Negative for pain and visual disturbance.  Respiratory: Negative for cough and shortness of breath.   Cardiovascular: Negative for chest pain and palpitations.  Gastrointestinal: Negative for abdominal pain and vomiting.  Genitourinary: Positive for menstrual problem and vaginal bleeding. Negative for dysuria, frequency, genital sores, hematuria, pelvic pain, vaginal discharge and vaginal pain.  Musculoskeletal: Negative for arthralgias and back pain.  Skin: Negative for color change and rash.  Neurological: Negative for dizziness, seizures, syncope, light-headedness and headaches.  All other systems reviewed and are negative.    Physical Exam Updated Vital Signs BP (!) 145/103 (BP Location: Right Arm)   Pulse 73   Temp 98.7 F (37.1 C) (Oral)   Resp 18   LMP 03/02/2016 (Approximate)   SpO2 100%   Physical Exam  Constitutional: She appears well-developed and well-nourished. No distress.  HENT:  Head: Normocephalic and  atraumatic.  Eyes: Conjunctivae are normal.  Neck: Neck supple.  Cardiovascular: Normal rate and regular rhythm.   No murmur heard. Pulmonary/Chest: Effort normal and breath sounds normal. No respiratory distress.  Abdominal: Soft. There is no tenderness.  Musculoskeletal: She exhibits no edema.  Neurological: She is alert.  Skin: Skin is warm and dry.  Psychiatric: She has a normal mood and affect.  Nursing note and vitals reviewed.    ED Treatments / Results  Labs (all labs ordered are listed, but only abnormal results are displayed) Labs Reviewed  URINALYSIS, ROUTINE W REFLEX MICROSCOPIC - Abnormal; Notable for the following:       Result Value   Hgb urine dipstick MODERATE (*)    Protein, ur 30 (*)    Leukocytes, UA SMALL (*)    Bacteria, UA RARE (*)    Squamous Epithelial / LPF 0-5 (*)    All other components within normal limits  CBC WITH DIFFERENTIAL/PLATELET - Abnormal; Notable for the following:    RDW 16.0 (*)    All other components within normal limits  BASIC METABOLIC PANEL  I-STAT BETA HCG BLOOD, ED ([facility], [facility], [facility] ONLY)    EKG  EKG Interpretation None       Radiology No results found.  Procedures Procedures (including critical care time)  Medications Ordered in ED Medications  gi cocktail (Maalox,Lidocaine,Donnatal) (30 mLs Oral Given 04/07/16 2150)     Initial Impression / Assessment and Plan / ED Course  I have reviewed the triage vital signs and the nursing notes.  Pertinent labs & imaging results that were available during my care of the patient were reviewed by me and considered in my medical decision making (see chart for details).  Clinical Course    Patient is a 40 year old female with past medical history as above who presents with vaginal bleeding. Patient was concerned that she is potentially pregnant as her period is about 3 weeks late. On presentation she is afebrile, mildly hypertensive with otherwise stable vital signs. Abdominal  exam is benign. CBC and CMP unremarkable. No leukocytosis or anemia. Pregnancy test negative. Patient reports her bleeding has been much less than a regular period, denies dizziness, passing out or other symptoms. No lower abdominal discomfort, abnormal vaginal discharge or pain. Patient denies exposure or concern for sexually transmitted infection. Do not think pelvic exam is necessary at this time.   Vaginal bleeding likely due to irregular menses. GI cocktail given for upper abdominal discomfort with improvement in symptoms.   Patient was discharged in stable condition. Advised follow up with her primary care doctor for reevaluation. Return precautions discussed and patient in agreement with plan.  Patient seen and examined with Dr. Anitra LauthPlunkett, ED attending  Final Clinical Impressions(s) / ED Diagnoses   Final diagnoses:  Vaginal bleeding  Menses, irregular    New Prescriptions New Prescriptions   No medications on file     Isa RankinAnn B Takya Vandivier, MD 04/07/16 2346    Gwyneth SproutWhitney Plunkett, MD 04/08/16 [phone]

## 2016-05-04 DIAGNOSIS — N910 Primary amenorrhea: Secondary | ICD-10-CM | POA: Diagnosis not present

## 2016-05-04 DIAGNOSIS — G43009 Migraine without aura, not intractable, without status migrainosus: Secondary | ICD-10-CM | POA: Diagnosis not present

## 2016-05-04 DIAGNOSIS — R300 Dysuria: Secondary | ICD-10-CM | POA: Diagnosis not present

## 2016-05-04 DIAGNOSIS — I10 Essential (primary) hypertension: Secondary | ICD-10-CM | POA: Diagnosis not present

## 2016-05-24 DIAGNOSIS — Z6830 Body mass index (BMI) 30.0-30.9, adult: Secondary | ICD-10-CM | POA: Diagnosis not present

## 2016-05-24 DIAGNOSIS — Z01419 Encounter for gynecological examination (general) (routine) without abnormal findings: Secondary | ICD-10-CM | POA: Diagnosis not present

## 2016-05-24 DIAGNOSIS — Z1151 Encounter for screening for human papillomavirus (HPV): Secondary | ICD-10-CM | POA: Diagnosis not present

## 2016-05-24 DIAGNOSIS — Z1231 Encounter for screening mammogram for malignant neoplasm of breast: Secondary | ICD-10-CM | POA: Diagnosis not present

## 2016-06-09 DIAGNOSIS — Z3202 Encounter for pregnancy test, result negative: Secondary | ICD-10-CM | POA: Diagnosis not present

## 2016-06-09 DIAGNOSIS — Z3169 Encounter for other general counseling and advice on procreation: Secondary | ICD-10-CM | POA: Diagnosis not present

## 2016-06-17 DIAGNOSIS — R51 Headache: Secondary | ICD-10-CM | POA: Diagnosis not present

## 2016-06-17 DIAGNOSIS — I10 Essential (primary) hypertension: Secondary | ICD-10-CM | POA: Diagnosis not present

## 2016-06-22 DIAGNOSIS — H04123 Dry eye syndrome of bilateral lacrimal glands: Secondary | ICD-10-CM | POA: Diagnosis not present

## 2016-06-24 ENCOUNTER — Other Ambulatory Visit (HOSPITAL_COMMUNITY): Payer: Self-pay | Admitting: Obstetrics and Gynecology

## 2016-06-24 DIAGNOSIS — Z3169 Encounter for other general counseling and advice on procreation: Secondary | ICD-10-CM

## 2016-06-27 ENCOUNTER — Ambulatory Visit (HOSPITAL_COMMUNITY): Payer: Self-pay

## 2016-07-19 DIAGNOSIS — E250 Congenital adrenogenital disorders associated with enzyme deficiency: Secondary | ICD-10-CM | POA: Diagnosis not present

## 2016-07-19 DIAGNOSIS — I10 Essential (primary) hypertension: Secondary | ICD-10-CM | POA: Diagnosis not present

## 2016-07-26 ENCOUNTER — Other Ambulatory Visit (HOSPITAL_COMMUNITY): Payer: Self-pay | Admitting: Obstetrics and Gynecology

## 2016-07-26 DIAGNOSIS — H40013 Open angle with borderline findings, low risk, bilateral: Secondary | ICD-10-CM | POA: Diagnosis not present

## 2016-07-26 DIAGNOSIS — H10413 Chronic giant papillary conjunctivitis, bilateral: Secondary | ICD-10-CM | POA: Diagnosis not present

## 2016-07-26 DIAGNOSIS — H04123 Dry eye syndrome of bilateral lacrimal glands: Secondary | ICD-10-CM | POA: Diagnosis not present

## 2016-07-26 DIAGNOSIS — Z3169 Encounter for other general counseling and advice on procreation: Secondary | ICD-10-CM

## 2016-07-28 ENCOUNTER — Ambulatory Visit (HOSPITAL_COMMUNITY)
Admission: RE | Admit: 2016-07-28 | Discharge: 2016-07-28 | Disposition: A | Discharge status: Home / Self Care | Payer: BLUE CROSS/BLUE SHIELD | Source: Ambulatory Visit | Attending: Obstetrics and Gynecology | Admitting: Obstetrics and Gynecology

## 2016-07-28 DIAGNOSIS — Z3169 Encounter for other general counseling and advice on procreation: Secondary | ICD-10-CM | POA: Insufficient documentation

## 2016-07-28 DIAGNOSIS — Z3141 Encounter for fertility testing: Secondary | ICD-10-CM | POA: Diagnosis not present

## 2016-07-28 MED ORDER — IOPAMIDOL (ISOVUE-300) INJECTION 61%
30.0000 mL | Freq: Once | INTRAVENOUS | Status: AC | PRN
  Administered 2016-07-28: 30 mL

## 2016-08-22 DIAGNOSIS — I10 Essential (primary) hypertension: Secondary | ICD-10-CM | POA: Diagnosis not present

## 2016-08-22 DIAGNOSIS — G43009 Migraine without aura, not intractable, without status migrainosus: Secondary | ICD-10-CM | POA: Diagnosis not present

## 2016-09-07 DIAGNOSIS — N971 Female infertility of tubal origin: Secondary | ICD-10-CM | POA: Diagnosis not present

## 2016-09-07 DIAGNOSIS — Z3141 Encounter for fertility testing: Secondary | ICD-10-CM | POA: Diagnosis not present

## 2016-09-14 ENCOUNTER — Encounter: Payer: Self-pay | Admitting: Internal Medicine

## 2016-09-22 ENCOUNTER — Telehealth: Payer: Self-pay

## 2016-09-22 NOTE — Telephone Encounter (Signed)
Patient called to advise she already has an endocrinologist. Patient does not need a new patient appointment.

## 2016-09-26 DIAGNOSIS — M79661 Pain in right lower leg: Secondary | ICD-10-CM | POA: Diagnosis not present

## 2016-09-26 DIAGNOSIS — M79662 Pain in left lower leg: Secondary | ICD-10-CM | POA: Diagnosis not present

## 2016-09-26 DIAGNOSIS — I10 Essential (primary) hypertension: Secondary | ICD-10-CM | POA: Diagnosis not present

## 2016-09-26 DIAGNOSIS — Z91010 Allergy to peanuts: Secondary | ICD-10-CM | POA: Diagnosis not present

## 2016-11-25 DIAGNOSIS — I10 Essential (primary) hypertension: Secondary | ICD-10-CM | POA: Diagnosis not present

## 2016-12-28 DIAGNOSIS — I10 Essential (primary) hypertension: Secondary | ICD-10-CM | POA: Diagnosis not present

## 2017-02-27 DIAGNOSIS — R062 Wheezing: Secondary | ICD-10-CM | POA: Diagnosis not present

## 2017-02-27 DIAGNOSIS — I10 Essential (primary) hypertension: Secondary | ICD-10-CM | POA: Diagnosis not present

## 2017-06-12 DIAGNOSIS — F411 Generalized anxiety disorder: Secondary | ICD-10-CM | POA: Diagnosis not present

## 2017-06-12 DIAGNOSIS — I10 Essential (primary) hypertension: Secondary | ICD-10-CM | POA: Diagnosis not present

## 2017-06-16 DIAGNOSIS — Z6829 Body mass index (BMI) 29.0-29.9, adult: Secondary | ICD-10-CM | POA: Diagnosis not present

## 2017-06-16 DIAGNOSIS — Z1231 Encounter for screening mammogram for malignant neoplasm of breast: Secondary | ICD-10-CM | POA: Diagnosis not present

## 2017-06-16 DIAGNOSIS — Z1151 Encounter for screening for human papillomavirus (HPV): Secondary | ICD-10-CM | POA: Diagnosis not present

## 2017-06-16 DIAGNOSIS — Z01419 Encounter for gynecological examination (general) (routine) without abnormal findings: Secondary | ICD-10-CM | POA: Diagnosis not present

## 2017-08-10 DIAGNOSIS — I10 Essential (primary) hypertension: Secondary | ICD-10-CM | POA: Diagnosis not present

## 2017-08-10 DIAGNOSIS — E250 Congenital adrenogenital disorders associated with enzyme deficiency: Secondary | ICD-10-CM | POA: Diagnosis not present

## 2017-08-14 ENCOUNTER — Other Ambulatory Visit: Payer: Self-pay | Admitting: Obstetrics and Gynecology

## 2017-10-30 DIAGNOSIS — H04123 Dry eye syndrome of bilateral lacrimal glands: Secondary | ICD-10-CM | POA: Diagnosis not present

## 2017-10-30 DIAGNOSIS — H10413 Chronic giant papillary conjunctivitis, bilateral: Secondary | ICD-10-CM | POA: Diagnosis not present

## 2017-10-30 DIAGNOSIS — H40013 Open angle with borderline findings, low risk, bilateral: Secondary | ICD-10-CM | POA: Diagnosis not present

## 2017-12-04 DIAGNOSIS — Z0000 Encounter for general adult medical examination without abnormal findings: Secondary | ICD-10-CM | POA: Diagnosis not present

## 2017-12-04 DIAGNOSIS — I10 Essential (primary) hypertension: Secondary | ICD-10-CM | POA: Diagnosis not present

## 2017-12-04 DIAGNOSIS — G43009 Migraine without aura, not intractable, without status migrainosus: Secondary | ICD-10-CM | POA: Diagnosis not present

## 2017-12-04 DIAGNOSIS — Z23 Encounter for immunization: Secondary | ICD-10-CM | POA: Diagnosis not present

## 2018-01-17 ENCOUNTER — Ambulatory Visit: Payer: BLUE CROSS/BLUE SHIELD | Admitting: Registered"

## 2018-07-04 DIAGNOSIS — Z01419 Encounter for gynecological examination (general) (routine) without abnormal findings: Secondary | ICD-10-CM | POA: Diagnosis not present

## 2018-07-04 DIAGNOSIS — Z124 Encounter for screening for malignant neoplasm of cervix: Secondary | ICD-10-CM | POA: Diagnosis not present

## 2018-07-04 DIAGNOSIS — Z6828 Body mass index (BMI) 28.0-28.9, adult: Secondary | ICD-10-CM | POA: Diagnosis not present

## 2018-07-04 DIAGNOSIS — Z1151 Encounter for screening for human papillomavirus (HPV): Secondary | ICD-10-CM | POA: Diagnosis not present

## 2018-07-04 DIAGNOSIS — Z1231 Encounter for screening mammogram for malignant neoplasm of breast: Secondary | ICD-10-CM | POA: Diagnosis not present

## 2018-07-06 DIAGNOSIS — R7303 Prediabetes: Secondary | ICD-10-CM | POA: Diagnosis not present

## 2018-08-17 DIAGNOSIS — E250 Congenital adrenogenital disorders associated with enzyme deficiency: Secondary | ICD-10-CM | POA: Diagnosis not present

## 2018-08-17 DIAGNOSIS — I10 Essential (primary) hypertension: Secondary | ICD-10-CM | POA: Diagnosis not present

## 2018-11-05 DIAGNOSIS — H10413 Chronic giant papillary conjunctivitis, bilateral: Secondary | ICD-10-CM | POA: Diagnosis not present

## 2018-11-05 DIAGNOSIS — H40013 Open angle with borderline findings, low risk, bilateral: Secondary | ICD-10-CM | POA: Diagnosis not present

## 2018-11-05 DIAGNOSIS — H04123 Dry eye syndrome of bilateral lacrimal glands: Secondary | ICD-10-CM | POA: Diagnosis not present

## 2019-01-01 DIAGNOSIS — Z0000 Encounter for general adult medical examination without abnormal findings: Secondary | ICD-10-CM | POA: Diagnosis not present

## 2019-01-01 DIAGNOSIS — I10 Essential (primary) hypertension: Secondary | ICD-10-CM | POA: Diagnosis not present

## 2019-01-01 DIAGNOSIS — Z8249 Family history of ischemic heart disease and other diseases of the circulatory system: Secondary | ICD-10-CM | POA: Diagnosis not present

## 2019-01-01 DIAGNOSIS — D509 Iron deficiency anemia, unspecified: Secondary | ICD-10-CM | POA: Diagnosis not present

## 2019-01-01 DIAGNOSIS — R7303 Prediabetes: Secondary | ICD-10-CM | POA: Diagnosis not present

## 2019-01-18 DIAGNOSIS — N760 Acute vaginitis: Secondary | ICD-10-CM | POA: Diagnosis not present

## 2019-01-18 DIAGNOSIS — Z118 Encounter for screening for other infectious and parasitic diseases: Secondary | ICD-10-CM | POA: Diagnosis not present

## 2019-01-18 DIAGNOSIS — B373 Candidiasis of vulva and vagina: Secondary | ICD-10-CM | POA: Diagnosis not present

## 2019-02-01 DIAGNOSIS — Z111 Encounter for screening for respiratory tuberculosis: Secondary | ICD-10-CM | POA: Diagnosis not present

## 2019-03-28 DIAGNOSIS — E611 Iron deficiency: Secondary | ICD-10-CM | POA: Diagnosis not present

## 2019-04-15 ENCOUNTER — Ambulatory Visit: Payer: BC Managed Care – PPO | Attending: Internal Medicine

## 2019-04-15 ENCOUNTER — Telehealth: Payer: Self-pay | Admitting: Hematology and Oncology

## 2019-04-15 DIAGNOSIS — Z20822 Contact with and (suspected) exposure to covid-19: Secondary | ICD-10-CM

## 2019-04-15 DIAGNOSIS — Z20828 Contact with and (suspected) exposure to other viral communicable diseases: Secondary | ICD-10-CM | POA: Diagnosis not present

## 2019-04-15 NOTE — Telephone Encounter (Signed)
A new hem appt has been scheduled for Lorraine Rodriguez to see Dr. Lorenso Courier on 12/18 at 10am. Pt has been made aware to arrive 15 minutes early.

## 2019-04-17 LAB — NOVEL CORONAVIRUS, NAA: SARS-CoV-2, NAA: NOT DETECTED

## 2019-05-10 NOTE — Progress Notes (Signed)
[place] [phone]   Fax:([phone]) [place] NOTE  Patient Care Team: Pa, [place] as PCP - General (Family Medicine)  Hematological/Oncological History # Leukopenia, neutropenia 1) 04/07/2016: WBC 4.9, Hgb 12.9, Plt 353. MCV 81.1 2) 01/02/2019: WBC 3.0, Hgb 10.9, MCV 79.3, Plt 381. No Differential. Ferritin 5.5 3) 03/28/2019: WBC 2.6, Hgb 12.8, Plt 296, MCV 84.8. ANC 1.1 4) 05/13/2019: establish care with Dr. Lorenso Courier    CHIEF COMPLAINTS/PURPOSE OF CONSULTATION:  Leukopenia  HISTORY OF PRESENTING ILLNESS:  Lorraine Rodriguez 44 y.o. female with medical history significant for HTN and glaucoma who presents for evaluation of leukopenia.   On review of the previous records Lorraine Rodriguez has a normal white blood cell count dating back to at least January 16, 2009.  At that time she was noted to have a white blood cell count of 9.4 hemoglobin 13.6 and a platelet count of 290.  Her counts were consistent throughout the course of 2010.  More recently on 04/07/2016 she was found to have a white blood cell count of 4.9 hemoglobin of 12.9 and platelets of 353.  First evidence of leukopenia is evident in the records from 01/02/2019 at which time she was noted to have white blood cell count of 3.0 MCV of 79.3 and platelets of 381.  No differential was collected on this white blood cell count.  Subsequently she had labs checked on 03/28/2019 which showed a white blood cell count of 2.6 MCV of 84.8 and ANC of 1.1.  Due to concern for this neutropenia the patient was referred to hematology for further evaluation and management.  On exam today on exam today Lorraine Rodriguez notes that she feels well.  She reports that she has had no issues with fevers, chills, sweats, nausea, vomiting or diarrhea.  She reports that she has no history of recurrent infections including lung infection sinus infections and urinary tract infections.  She reports that she has been  taking iron pills due to having iron deficiency anemia.  Full 10 point ROS is listed below.   On further discussion she notes that she is on a restricted diet because she worries about her diabetes type 2.  She reports that she has decreased her salt and sugar intake and has had improvement in her weight.  She notes that she eats red meat and chicken, but avoids pork because it gives her nosebleeds.  She is also taking a Centrum multivitamin daily.  On further evaluation she reports that she is a never smoker and does not drink any alcohol.  She has no remarkable family history for blood diseases.  MEDICAL HISTORY:  Past Medical History:  Diagnosis Date  . Allergy    hayfever  . Glaucoma    both eyes  . Hypertension     SURGICAL HISTORY: Past Surgical History:  Procedure Laterality Date  . CESAREAN SECTION  2010    SOCIAL HISTORY: Social History   Socioeconomic History  . Marital status: Single    Spouse name: Not on file  . Number of children: Not on file  . Years of education: Not on file  . Highest education level: Not on file  Occupational History  . Not on file  Tobacco Use  . Smoking status: Never Smoker  . Smokeless tobacco: Never Used  Substance and Sexual Activity  . Alcohol use: No    Alcohol/week: 0.0 standard drinks  . Drug use: No  . Sexual activity: Not on file  Other Topics Concern  . Not on file  Social History Narrative  . Not on file   Social Determinants of Health   Financial Resource Strain:   . Difficulty of Paying Living Expenses: Not on file  Food Insecurity:   . Worried About Charity fundraiser in the Last Year: Not on file  . Ran Out of Food in the Last Year: Not on file  Transportation Needs:   . Lack of Transportation (Medical): Not on file  . Lack of Transportation (Non-Medical): Not on file  Physical Activity:   . Days of Exercise per Week: Not on file  . Minutes of Exercise per Session: Not on file  Stress:   . Feeling of Stress  : Not on file  Social Connections:   . Frequency of Communication with Friends and Family: Not on file  . Frequency of Social Gatherings with Friends and Family: Not on file  . Attends Religious Services: Not on file  . Active Member of Clubs or Organizations: Not on file  . Attends Archivist Meetings: Not on file  . Marital Status: Not on file  Intimate Partner Violence:   . Fear of Current or Ex-Partner: Not on file  . Emotionally Abused: Not on file  . Physically Abused: Not on file  . Sexually Abused: Not on file    FAMILY HISTORY: Family History  Problem Relation Age of Onset  . Diabetes Mother   . Hypertension Mother   . Hypertension Father   . Glaucoma Maternal Grandfather   . Heart disease Maternal Grandfather        congential heart failure  . Kidney failure Paternal Grandmother   . Alzheimer's disease Paternal Grandfather     ALLERGIES:  is allergic to peanuts [peanut oil].  MEDICATIONS:  Current Outpatient Medications  Medication Sig Dispense Refill  . ibuprofen (ADVIL,MOTRIN) 600 MG tablet Take 1 tablet (600 mg total) by mouth every 8 (eight) hours as needed. 30 tablet 0  . BIOTIN PO Take 1 tablet by mouth daily.    Marland Kitchen MAGNESIUM PO Take 1 tablet by mouth daily.    . Prenatal Vit-Fe Fumarate-FA (PRENATAL MULTIVITAMIN) TABS tablet Take 1 tablet by mouth daily at 12 noon.     No current facility-administered medications for this visit.    REVIEW OF SYSTEMS:   Constitutional: ( - ) fevers, ( - )  chills , ( - ) night sweats Eyes: ( - ) blurriness of vision, ( - ) double vision, ( - ) watery eyes Ears, nose, mouth, throat, and face: ( - ) mucositis, ( - ) sore throat Respiratory: ( - ) cough, ( - ) dyspnea, ( - ) wheezes Cardiovascular: ( - ) palpitation, ( - ) chest discomfort, ( - ) lower extremity swelling Gastrointestinal:  ( - ) nausea, ( - ) heartburn, ( - ) change in bowel habits Skin: ( - ) abnormal skin rashes Lymphatics: ( - ) new  lymphadenopathy, ( - ) easy bruising Neurological: ( - ) numbness, ( - ) tingling, ( - ) new weaknesses Behavioral/Psych: ( - ) mood change, ( - ) new changes  All other systems were reviewed with the patient and are negative.  PHYSICAL EXAMINATION: ECOG PERFORMANCE STATUS: 0 - Asymptomatic  Vitals:   05/13/19 0959  BP: (!) 123/97  Pulse: 79  Resp: 17  Temp: 97.8 F (36.6 C)  SpO2: 100%   Filed Weights   05/13/19 0959  Weight: 156 lb 6.4 oz (  70.9 kg)    GENERAL: well appearing middle aged [place] American female in NAD  SKIN: skin color, texture, turgor are normal, no rashes or significant lesions EYES: conjunctiva are pink and non-injected, sclera clear LUNGS: clear to auscultation and percussion with normal breathing effort HEART: regular rate & rhythm and no murmurs and no lower extremity edema ABDOMEN: soft, non-tender, non-distended, normal bowel sounds. No HSM.  Musculoskeletal: no cyanosis of digits and no clubbing  PSYCH: alert & oriented x 3, fluent speech NEURO: no focal motor/sensory deficits  LABORATORY DATA:  I have reviewed the data as listed CBC Latest Ref Rng & Units 05/13/2019 04/07/2016 02/14/2009  WBC 4.0 - 10.5 K/uL 3.6(L) 4.9 10.9(H)  Hemoglobin 12.0 - 15.0 g/dL 13.8 12.9 12.0  Hematocrit 36.0 - 46.0 % 43.1 39.4 35.8(L)  Platelets 150 - 400 K/uL 252 353 210    CMP Latest Ref Rng & Units 05/13/2019 04/07/2016 02/15/2009  Glucose 70 - 99 mg/dL 74 90 149(H)  BUN 6 - 20 mg/dL '15 14 12  ' Creatinine 0.44 - 1.00 mg/dL 1.01(H) 0.98 0.97  Sodium 135 - 145 mmol/L 137 138 133(L)  Potassium 3.5 - 5.1 mmol/L 4.2 3.8 3.3(L)  Chloride 98 - 111 mmol/L 106 107 104  CO2 22 - 32 mmol/L '23 26 22  ' Calcium 8.9 - 10.3 mg/dL 8.9 9.2 8.4  Total Protein 6.5 - 8.1 g/dL 8.0 - -  Total Bilirubin 0.3 - 1.2 mg/dL 0.4 - -  Alkaline Phos 38 - 126 U/L 52 - -  AST 15 - 41 U/L 21 - -  ALT 0 - 44 U/L 14 - -    PATHOLOGY: None to review.   BLOOD FILM:  Review of the  peripheral blood smear showed normal appearing white cells with neutrophils that were appropriately lobated and granulated. There was no predominance of bi-lobed or hyper-segmented neutrophils appreciated. No Dohle bodies were noted. There was no left shifting, immature forms or blasts noted. Lymphocytes remain normal in size without any predominance of large granular lymphocytes. Small number of atypical lymphocytes and reactive lymphocytes noted.  Red cells show no anisopoikilocytosis, macrocytes , microcytes or polychromasia. There were no schistocytes, target cells, echinocytes, acanthocytes, dacrocytes, or stomatocytes.There was no rouleaux formation, nucleated red cells, or intra-cellular inclusions noted. The platelets are normal in size, shape, and color without any clumping evident.  RADIOGRAPHIC STUDIES: None to review: No results found.  ASSESSMENT & PLAN Hazeline Charnley 44 y.o. female with medical history significant for HTN and glaucoma who presents for evaluation of leukopenia.  After review the labs and discussion with the patient there are no clear causes for the patient's leukopenia.  On further review the patient has a mild neutropenia with ANC of 1.1 on last evaluation.  She has no other clear hematological abnormalities with a normal hemoglobin and platelet count.  Of note she is reportedly being treated for an iron deficiency anemia as well.  In order to help determine the etiology of her leukopenia today we will order a full nutritional assessment as well as common viral serologies such as hepatitis B, hepatitis C, and HIV.  Review of the peripheral blood film does show an increase in atypical lymphocytes, but no other clear abnormalities with the white blood cells.  At this time there is no clear indication for a bone marrow biopsy as there are no other hematological abnormalities and her neutropenia is mild.  Given her lack of any concerning symptoms or recurrent infections it would  appear that  this is a benign process.  I would recommend that we follow-up with her in approximately 3 months time to recheck her labs and to determine if any further work-up is required.  #Leukopenia, neutropenia --today will order CBC, CMP, and peripheral blood film --additionally to assess for nutritional etiology we will order Vitamin B12 ,folate, MMA, and homocysteine --will collect a Hep B, Hep C and HIV serologies to r/o viral etiology.  --no clear indication for a bone marrow biopsy at this time as there are no other hematological abnormalities and the leukopenia is mild --RTC in 3 months for continued evaluation.  #Iron Deficiency Anemia --taking PO iron supplementation for reported iron deficiency anemia --recheck iron panel/ ferritin today for baseline. --continue to monitor   Orders Placed This Encounter  Procedures  . CBC with Differential ([facility] Center Only)    Standing Status:   Future    Number of Occurrences:   1    Standing Expiration Date:   05/12/2020  . Save Smear (SSMR)    Standing Status:   Future    Number of Occurrences:   1    Standing Expiration Date:   05/12/2020  . CMP ([facility] only)    Standing Status:   Future    Number of Occurrences:   1    Standing Expiration Date:   05/12/2020  . Lactate dehydrogenase (LDH)    Standing Status:   Future    Number of Occurrences:   1    Standing Expiration Date:   05/12/2020  . Iron and TIBC    Standing Status:   Future    Number of Occurrences:   1    Standing Expiration Date:   05/12/2020  . Ferritin    Standing Status:   Future    Number of Occurrences:   1    Standing Expiration Date:   05/12/2020  . Vitamin B12    Standing Status:   Future    Number of Occurrences:   1    Standing Expiration Date:   05/12/2020  . Folate, Serum    Standing Status:   Future    Number of Occurrences:   1    Standing Expiration Date:   05/12/2020  . Methylmalonic acid, serum    Standing Status:   Future    Number of  Occurrences:   1    Standing Expiration Date:   05/12/2020  . Homocysteine, serum    Standing Status:   Future    Number of Occurrences:   1    Standing Expiration Date:   05/12/2020  . Hepatitis C antibody    Standing Status:   Future    Number of Occurrences:   1    Standing Expiration Date:   05/12/2020  . Hepatitis B surface antigen    Standing Status:   Future    Number of Occurrences:   1    Standing Expiration Date:   05/12/2020  . Hepatitis B surface antibody    Standing Status:   Future    Number of Occurrences:   1    Standing Expiration Date:   05/12/2020  . Hepatitis B core antibody, total    Standing Status:   Future    Number of Occurrences:   1    Standing Expiration Date:   05/12/2020  . HIV antibody (with reflex)    Standing Status:   Future    Number of Occurrences:   1    Standing  Expiration Date:   05/12/2020  . Copper, serum    Standing Status:   Future    Number of Occurrences:   1    Standing Expiration Date:   05/12/2020    All questions were answered. The patient knows to call the clinic with any problems, questions or concerns.  A total of more than 60 minutes were spent on this encounter and over half of that time was spent on counseling and coordination of care as outlined above.   Ledell Peoples, MD Department of Hematology/Oncology [place] at [facility] Phone: [phone] Pager: [phone] Email: [email or web address]  05/13/2019 1:21 PM

## 2019-05-13 ENCOUNTER — Encounter: Payer: Self-pay | Admitting: Hematology and Oncology

## 2019-05-13 ENCOUNTER — Inpatient Hospital Stay: Payer: BC Managed Care – PPO | Attending: Hematology and Oncology | Admitting: Hematology and Oncology

## 2019-05-13 ENCOUNTER — Inpatient Hospital Stay: Payer: BC Managed Care – PPO

## 2019-05-13 ENCOUNTER — Other Ambulatory Visit: Payer: Self-pay

## 2019-05-13 VITALS — BP 123/97 | HR 79 | Temp 97.8°F | Resp 17 | Ht 63.5 in | Wt 156.4 lb

## 2019-05-13 DIAGNOSIS — Z833 Family history of diabetes mellitus: Secondary | ICD-10-CM | POA: Insufficient documentation

## 2019-05-13 DIAGNOSIS — Z79899 Other long term (current) drug therapy: Secondary | ICD-10-CM | POA: Diagnosis not present

## 2019-05-13 DIAGNOSIS — Z818 Family history of other mental and behavioral disorders: Secondary | ICD-10-CM | POA: Diagnosis not present

## 2019-05-13 DIAGNOSIS — H409 Unspecified glaucoma: Secondary | ICD-10-CM | POA: Insufficient documentation

## 2019-05-13 DIAGNOSIS — E119 Type 2 diabetes mellitus without complications: Secondary | ICD-10-CM | POA: Insufficient documentation

## 2019-05-13 DIAGNOSIS — D709 Neutropenia, unspecified: Secondary | ICD-10-CM | POA: Insufficient documentation

## 2019-05-13 DIAGNOSIS — Z8249 Family history of ischemic heart disease and other diseases of the circulatory system: Secondary | ICD-10-CM | POA: Diagnosis not present

## 2019-05-13 DIAGNOSIS — Z841 Family history of disorders of kidney and ureter: Secondary | ICD-10-CM

## 2019-05-13 DIAGNOSIS — D509 Iron deficiency anemia, unspecified: Secondary | ICD-10-CM | POA: Diagnosis not present

## 2019-05-13 DIAGNOSIS — D500 Iron deficiency anemia secondary to blood loss (chronic): Secondary | ICD-10-CM

## 2019-05-13 DIAGNOSIS — I10 Essential (primary) hypertension: Secondary | ICD-10-CM | POA: Diagnosis not present

## 2019-05-13 LAB — CBC WITH DIFFERENTIAL (CANCER CENTER ONLY)
Abs Immature Granulocytes: 0 10*3/uL (ref 0.00–0.07)
Basophils Absolute: 0 10*3/uL (ref 0.0–0.1)
Basophils Relative: 1 %
Eosinophils Absolute: 0.1 10*3/uL (ref 0.0–0.5)
Eosinophils Relative: 3 %
HCT: 43.1 % (ref 36.0–46.0)
Hemoglobin: 13.8 g/dL (ref 12.0–15.0)
Immature Granulocytes: 0 %
Lymphocytes Relative: 42 %
Lymphs Abs: 1.5 10*3/uL (ref 0.7–4.0)
MCH: 27.6 pg (ref 26.0–34.0)
MCHC: 32 g/dL (ref 30.0–36.0)
MCV: 86.2 fL (ref 80.0–100.0)
Monocytes Absolute: 0.3 10*3/uL (ref 0.1–1.0)
Monocytes Relative: 7 %
Neutro Abs: 1.7 10*3/uL (ref 1.7–7.7)
Neutrophils Relative %: 47 %
Platelet Count: 252 10*3/uL (ref 150–400)
RBC: 5 MIL/uL (ref 3.87–5.11)
RDW: 15 % (ref 11.5–15.5)
WBC Count: 3.6 10*3/uL — ABNORMAL LOW (ref 4.0–10.5)
nRBC: 0 % (ref 0.0–0.2)

## 2019-05-13 LAB — IRON AND TIBC
Iron: 105 ug/dL (ref 41–142)
Saturation Ratios: 32 % (ref 21–57)
TIBC: 323 ug/dL (ref 236–444)
UIBC: 218 ug/dL (ref 120–384)

## 2019-05-13 LAB — CMP (CANCER CENTER ONLY)
ALT: 14 U/L (ref 0–44)
AST: 21 U/L (ref 15–41)
Albumin: 4.2 g/dL (ref 3.5–5.0)
Alkaline Phosphatase: 52 U/L (ref 38–126)
Anion gap: 8 (ref 5–15)
BUN: 15 mg/dL (ref 6–20)
CO2: 23 mmol/L (ref 22–32)
Calcium: 8.9 mg/dL (ref 8.9–10.3)
Chloride: 106 mmol/L (ref 98–111)
Creatinine: 1.01 mg/dL — ABNORMAL HIGH (ref 0.44–1.00)
GFR, Est AFR Am: >60 mL/min (ref >60)
GFR, Estimated: >60 mL/min (ref >60)
Glucose, Bld: 74 mg/dL (ref 70–99)
Potassium: 4.2 mmol/L (ref 3.5–5.1)
Sodium: 137 mmol/L (ref 135–145)
Total Bilirubin: 0.4 mg/dL (ref 0.3–1.2)
Total Protein: 8 g/dL (ref 6.5–8.1)

## 2019-05-13 LAB — SAVE SMEAR(SSMR), FOR PROVIDER SLIDE REVIEW

## 2019-05-13 LAB — HEPATITIS B SURFACE ANTIGEN: Hepatitis B Surface Ag: NONREACTIVE

## 2019-05-13 LAB — LACTATE DEHYDROGENASE: LDH: 166 U/L (ref 98–192)

## 2019-05-13 LAB — HEPATITIS C ANTIBODY: HCV Ab: NONREACTIVE

## 2019-05-13 LAB — HIV ANTIBODY (ROUTINE TESTING W REFLEX): HIV Screen 4th Generation wRfx: NONREACTIVE

## 2019-05-13 LAB — FERRITIN: Ferritin: 35 ng/mL (ref 11–307)

## 2019-05-13 LAB — HEPATITIS B SURFACE ANTIBODY,QUALITATIVE: Hep B S Ab: NONREACTIVE

## 2019-05-13 LAB — HEPATITIS B CORE ANTIBODY, TOTAL: Hep B Core Total Ab: NONREACTIVE

## 2019-05-13 LAB — VITAMIN B12: Vitamin B-12: 585 pg/mL (ref 180–914)

## 2019-05-13 LAB — FOLATE: Folate: 18.7 ng/mL (ref >5.9)

## 2019-05-14 ENCOUNTER — Telehealth: Payer: Self-pay | Admitting: Hematology and Oncology

## 2019-05-14 LAB — HOMOCYSTEINE: Homocysteine: 6.8 umol/L (ref 0.0–14.5)

## 2019-05-14 NOTE — Telephone Encounter (Signed)
Scheduled per los. Called and left msg. Mailed printout  °

## 2019-05-16 LAB — COPPER, SERUM: Copper: 101 ug/dL (ref 80–158)

## 2019-05-17 LAB — METHYLMALONIC ACID, SERUM: Methylmalonic Acid, Quantitative: 181 nmol/L (ref 0–378)

## 2019-05-22 ENCOUNTER — Telehealth: Payer: Self-pay | Admitting: *Deleted

## 2019-05-22 NOTE — Telephone Encounter (Signed)
LM with note below. To call if has any questions 

## 2019-05-22 NOTE — Telephone Encounter (Signed)
-----   Message from Kyra Searles, RN sent at 05/22/2019  3:59 PM EST -----  ----- Message ----- From: Jaci Standard, MD Sent: 05/22/2019  11:47 AM EST To: Kyra Searles, RN  Please call Mrs. Sawtelle to discuss her lab results. Findings show a mildly decreased WBC, but no nutritional deficiencies or other abnormalities noted. We can assess her again in about 3 months, but as for now there is no clear cause or anything specific she has to do.  Azucena Freed  ----- Message ----- From: Leory Plowman, Lab [facility] [place] Sent: 05/13/2019  11:02 AM EST To: Jaci Standard, MD

## 2019-06-18 ENCOUNTER — Telehealth: Payer: Self-pay | Admitting: Hematology and Oncology

## 2019-06-18 NOTE — Telephone Encounter (Signed)
Returned patient's phone call regarding rescheduling an appointment, left a voicemail. 

## 2019-06-22 ENCOUNTER — Ambulatory Visit: Payer: BC Managed Care – PPO | Attending: Internal Medicine

## 2019-06-22 DIAGNOSIS — Z23 Encounter for immunization: Secondary | ICD-10-CM | POA: Insufficient documentation

## 2019-06-22 NOTE — Progress Notes (Signed)
   Covid-19 Vaccination Clinic  Name:  Lorraine Rodriguez    MRN: [id number] DOB: [birth year]  06/22/2019  [name] was observed post Covid-19 immunization for 15 minutes without incidence. She was provided with Vaccine Information Sheet and instruction to access the V-Safe system.   [name] was instructed to call 911 with any severe reactions post vaccine: Marland Kitchen Difficulty breathing  . Swelling of your face and throat  . A fast heartbeat  . A bad rash all over your body  . Dizziness and weakness    Immunizations Administered    Name Date Dose VIS Date Route   Pfizer COVID-19 Vaccine 06/22/2019  5:05 PM 0.3 mL 04/05/2019 Intramuscular   Manufacturer: [employer], [employer]   Lot: [id number]   NDC: [address]-4

## 2019-07-13 ENCOUNTER — Ambulatory Visit: Payer: BC Managed Care – PPO | Attending: Internal Medicine

## 2019-07-13 DIAGNOSIS — Z23 Encounter for immunization: Secondary | ICD-10-CM

## 2019-07-13 NOTE — Progress Notes (Signed)
   Covid-19 Vaccination Clinic  Name:  Lorraine Rodriguez    MRN: [id number] DOB: 26-May-1975  07/13/2019  [name] was observed post Covid-19 immunization for 15 minutes without incident. She was provided with Vaccine Information Sheet and instruction to access the V-Safe system.   [name] was instructed to call 911 with any severe reactions post vaccine: Marland Kitchen Difficulty breathing  . Swelling of face and throat  . A fast heartbeat  . A bad rash all over body  . Dizziness and weakness   Immunizations Administered    Name Date Dose VIS Date Route   Pfizer COVID-19 Vaccine 07/13/2019  3:46 PM 0.3 mL 04/05/2019 Intramuscular   Manufacturer: [employer], [employer]   Lot: [id number]   NDC: [address]-2

## 2019-07-22 ENCOUNTER — Other Ambulatory Visit: Payer: Self-pay | Admitting: Hematology and Oncology

## 2019-07-22 DIAGNOSIS — D709 Neutropenia, unspecified: Secondary | ICD-10-CM

## 2019-07-24 ENCOUNTER — Other Ambulatory Visit: Payer: Self-pay

## 2019-07-24 ENCOUNTER — Inpatient Hospital Stay: Payer: BC Managed Care – PPO | Attending: Hematology and Oncology

## 2019-07-24 ENCOUNTER — Inpatient Hospital Stay (HOSPITAL_BASED_OUTPATIENT_CLINIC_OR_DEPARTMENT_OTHER): Payer: BC Managed Care – PPO | Admitting: Hematology and Oncology

## 2019-07-24 ENCOUNTER — Encounter: Payer: Self-pay | Admitting: Hematology and Oncology

## 2019-07-24 VITALS — BP 123/88 | HR 76 | Temp 98.3°F | Resp 17 | Ht 63.5 in | Wt 158.6 lb

## 2019-07-24 DIAGNOSIS — Z79899 Other long term (current) drug therapy: Secondary | ICD-10-CM | POA: Diagnosis not present

## 2019-07-24 DIAGNOSIS — Z833 Family history of diabetes mellitus: Secondary | ICD-10-CM | POA: Insufficient documentation

## 2019-07-24 DIAGNOSIS — Z8249 Family history of ischemic heart disease and other diseases of the circulatory system: Secondary | ICD-10-CM | POA: Insufficient documentation

## 2019-07-24 DIAGNOSIS — R5383 Other fatigue: Secondary | ICD-10-CM | POA: Diagnosis not present

## 2019-07-24 DIAGNOSIS — Z83511 Family history of glaucoma: Secondary | ICD-10-CM | POA: Insufficient documentation

## 2019-07-24 DIAGNOSIS — D709 Neutropenia, unspecified: Secondary | ICD-10-CM | POA: Diagnosis not present

## 2019-07-24 DIAGNOSIS — Z818 Family history of other mental and behavioral disorders: Secondary | ICD-10-CM | POA: Insufficient documentation

## 2019-07-24 DIAGNOSIS — Z841 Family history of disorders of kidney and ureter: Secondary | ICD-10-CM | POA: Insufficient documentation

## 2019-07-24 LAB — CBC WITH DIFFERENTIAL (CANCER CENTER ONLY)
Abs Immature Granulocytes: 0.01 10*3/uL (ref 0.00–0.07)
Basophils Absolute: 0 10*3/uL (ref 0.0–0.1)
Basophils Relative: 1 %
Eosinophils Absolute: 0.1 10*3/uL (ref 0.0–0.5)
Eosinophils Relative: 3 %
HCT: 40.2 % (ref 36.0–46.0)
Hemoglobin: 12.7 g/dL (ref 12.0–15.0)
Immature Granulocytes: 0 %
Lymphocytes Relative: 38 %
Lymphs Abs: 1.3 10*3/uL (ref 0.7–4.0)
MCH: 27.9 pg (ref 26.0–34.0)
MCHC: 31.6 g/dL (ref 30.0–36.0)
MCV: 88.4 fL (ref 80.0–100.0)
Monocytes Absolute: 0.2 10*3/uL (ref 0.1–1.0)
Monocytes Relative: 6 %
Neutro Abs: 1.8 10*3/uL (ref 1.7–7.7)
Neutrophils Relative %: 52 %
Platelet Count: 262 10*3/uL (ref 150–400)
RBC: 4.55 MIL/uL (ref 3.87–5.11)
RDW: 14.3 % (ref 11.5–15.5)
WBC Count: 3.5 10*3/uL — ABNORMAL LOW (ref 4.0–10.5)
nRBC: 0 % (ref 0.0–0.2)

## 2019-07-24 LAB — IRON AND TIBC
Iron: 79 ug/dL (ref 41–142)
Saturation Ratios: 25 % (ref 21–57)
TIBC: 313 ug/dL (ref 236–444)
UIBC: 233 ug/dL (ref 120–384)

## 2019-07-24 LAB — CMP (CANCER CENTER ONLY)
ALT: 11 U/L (ref 0–44)
AST: 19 U/L (ref 15–41)
Albumin: 3.9 g/dL (ref 3.5–5.0)
Alkaline Phosphatase: 50 U/L (ref 38–126)
Anion gap: 10 (ref 5–15)
BUN: 17 mg/dL (ref 6–20)
CO2: 24 mmol/L (ref 22–32)
Calcium: 8.9 mg/dL (ref 8.9–10.3)
Chloride: 107 mmol/L (ref 98–111)
Creatinine: 0.87 mg/dL (ref 0.44–1.00)
GFR, Est AFR Am: >60 mL/min (ref >60)
GFR, Estimated: >60 mL/min (ref >60)
Glucose, Bld: 75 mg/dL (ref 70–99)
Potassium: 3.7 mmol/L (ref 3.5–5.1)
Sodium: 141 mmol/L (ref 135–145)
Total Bilirubin: 0.2 mg/dL — ABNORMAL LOW (ref 0.3–1.2)
Total Protein: 7.7 g/dL (ref 6.5–8.1)

## 2019-07-24 LAB — SAVE SMEAR(SSMR), FOR PROVIDER SLIDE REVIEW

## 2019-07-24 LAB — LACTATE DEHYDROGENASE: LDH: 163 U/L (ref 98–192)

## 2019-07-24 LAB — FERRITIN: Ferritin: 35 ng/mL (ref 11–307)

## 2019-07-24 NOTE — Progress Notes (Signed)
[place] Telephone:([phone]) [phone]   [phone]  PROGRESS NOTE  Patient Care Team: Pa, [place] as PCP - General (Family Medicine)  Hematological/Oncological History # Leukopenia, neutropenia 1) 04/07/2016: WBC 4.9, Hgb 12.9, Plt 353. MCV 81.1 2) 01/02/2019: WBC 3.0, Hgb 10.9, MCV 79.3, Plt 381. No Differential. Ferritin 5.5 3) 03/28/2019: WBC 2.6, Hgb 12.8, Plt 296, MCV 84.8. ANC 1.1 4) 05/13/2019: establish care with Dr. Lorenso Courier    Interval History:  Lorraine Rodriguez 44 y.o. female with medical history significant for leukopenia who presents for a follow up visit. The patient's last visit was on 05/13/2019 at which time she established care. In the interim since the last visit the patient has been well with no changes in her health.   On exam today Lorraine Rodriguez notes that she feels well.  She notes that she has not had any major changes in her health since the last saw [place] in January 2021.  She notes that she has not had any changes in her weight and denies having any fevers, chills, sweats, nausea, vomiting or diarrhea.  She notes that she does have some issues with fatigue, though she does work 2 jobs currently.  She has not noticed any other changes in her health since our last visit.  A full 10 point ROS is listed below.  MEDICAL HISTORY:  Past Medical History:  Diagnosis Date  . Allergy    hayfever  . Glaucoma    both eyes  . Hypertension     SURGICAL HISTORY: Past Surgical History:  Procedure Laterality Date  . CESAREAN SECTION  2010    SOCIAL HISTORY: Social History   Socioeconomic History  . Marital status: Single    Spouse name: Not on file  . Number of children: Not on file  . Years of education: Not on file  . Highest education level: Not on file  Occupational History  . Not on file  Tobacco Use  . Smoking status: Never Smoker  . Smokeless tobacco: Never Used  Substance and Sexual Activity  . Alcohol use: No     Alcohol/week: 0.0 standard drinks  . Drug use: No  . Sexual activity: Not on file  Other Topics Concern  . Not on file  Social History Narrative  . Not on file   Social Determinants of Health   Financial Resource Strain:   . Difficulty of Paying Living Expenses:   Food Insecurity:   . Worried About Charity fundraiser in the Last Year:   . Arboriculturist in the Last Year:   Transportation Needs:   . Film/video editor (Medical):   Marland Kitchen Lack of Transportation (Non-Medical):   Physical Activity:   . Days of Exercise per Week:   . Minutes of Exercise per Session:   Stress:   . Feeling of Stress :   Social Connections:   . Frequency of Communication with Friends and Family:   . Frequency of Social Gatherings with Friends and Family:   . Attends Religious Services:   . Active Member of Clubs or Organizations:   . Attends Archivist Meetings:   Marland Kitchen Marital Status:   Intimate Partner Violence:   . Fear of Current or Ex-Partner:   . Emotionally Abused:   Marland Kitchen Physically Abused:   . Sexually Abused:     FAMILY HISTORY: Family History  Problem Relation Age of Onset  . Diabetes Mother   . Hypertension Mother   .  Hypertension Father   . Glaucoma Maternal Grandfather   . Heart disease Maternal Grandfather        congential heart failure  . Kidney failure Paternal Grandmother   . Alzheimer's disease Paternal Grandfather     ALLERGIES:  is allergic to peanuts [peanut oil].  MEDICATIONS:  Current Outpatient Medications  Medication Sig Dispense Refill  . multivitamin-iron-minerals-folic acid (CENTRUM) chewable tablet Chew 1 tablet by mouth daily.    Marland Kitchen ibuprofen (ADVIL,MOTRIN) 600 MG tablet Take 1 tablet (600 mg total) by mouth every 8 (eight) hours as needed. 30 tablet 0   No current facility-administered medications for this visit.    REVIEW OF SYSTEMS:   Constitutional: ( - ) fevers, ( - )  chills , ( - ) night sweats Eyes: ( - ) blurriness of vision, ( - )  double vision, ( - ) watery eyes Ears, nose, mouth, throat, and face: ( - ) mucositis, ( - ) sore throat Respiratory: ( - ) cough, ( - ) dyspnea, ( - ) wheezes Cardiovascular: ( - ) palpitation, ( - ) chest discomfort, ( - ) lower extremity swelling Gastrointestinal:  ( - ) nausea, ( - ) heartburn, ( - ) change in bowel habits Skin: ( - ) abnormal skin rashes Lymphatics: ( - ) new lymphadenopathy, ( - ) easy bruising Neurological: ( - ) numbness, ( - ) tingling, ( - ) new weaknesses Behavioral/Psych: ( - ) mood change, ( - ) new changes  All other systems were reviewed with the patient and are negative.  PHYSICAL EXAMINATION: ECOG PERFORMANCE STATUS: 0 - Asymptomatic  Vitals:   07/24/19 1442  BP: 123/88  Pulse: 76  Resp: 17  Temp: 98.3 F (36.8 C)  SpO2: 100%   Filed Weights   07/24/19 1442  Weight: 158 lb 9.6 oz (71.9 kg)    GENERAL: well appearing middle aged [place] American female in NAD  SKIN: skin color, texture, turgor are normal, no rashes or significant lesions EYES: conjunctiva are pink and non-injected, sclera clear LUNGS: clear to auscultation and percussion with normal breathing effort HEART: regular rate & rhythm and no murmurs and no lower extremity edema ABDOMEN: soft, non-tender, non-distended, normal bowel sounds. No HSM.  Musculoskeletal: no cyanosis of digits and no clubbing  PSYCH: alert & oriented x 3, fluent speech NEURO: no focal motor/sensory deficits   LABORATORY DATA:  I have reviewed the data as listed CBC Latest Ref Rng & Units 07/24/2019 05/13/2019 04/07/2016  WBC 4.0 - 10.5 K/uL 3.5(L) 3.6(L) 4.9  Hemoglobin 12.0 - 15.0 g/dL 12.7 13.8 12.9  Hematocrit 36.0 - 46.0 % 40.2 43.1 39.4  Platelets 150 - 400 K/uL 262 252 353    CMP Latest Ref Rng & Units 07/24/2019 05/13/2019 04/07/2016  Glucose 70 - 99 mg/dL 75 74 90  BUN 6 - 20 mg/dL _0 Creatinine 0.44 - 1.00 mg/dL 0.87 1.01(H) 0.98  Sodium 135 - 145 mmol/L 141 137 138  Potassium 3.5 -  5.1 mmol/L 3.7 4.2 3.8  Chloride 98 - 111 mmol/L 107 106 107  CO2 22 - 32 mmol/L _1 Calcium 8.9 - 10.3 mg/dL 8.9 8.9 9.2  Total Protein 6.5 - 8.1 g/dL 7.7 8.0 -  Total Bilirubin 0.3 - 1.2 mg/dL 0.2(L) 0.4 -  Alkaline Phos 38 - 126 U/L 50 52 -  AST 15 - 41 U/L 19 21 -  ALT 0 - 44 U/L 11 14 -    RADIOGRAPHIC STUDIES:  None relevant to review.  No results found.  ASSESSMENT & PLAN Lorraine Rodriguez 44 y.o. female with medical history significant for leukopenia who presents for a follow up visit.  After review the labs and discussion with the patient her findings are consistent with a stable leukopenia.  Fortunately the patient has not had any major changes in her health since her last visit in January.  Our prior evaluation showed no clear etiology including no nutritional abnormalities or other overt causes of the patient's leukopenia.  At this time there is no clear indication for a bone marrow biopsy, however we could consider this if the patient had a marked decrease in the white blood cell count or other cell lines.  I would recommend that she follow-up in our clinic in approximately 6 months time in order to reassess and assure that there are no new symptoms and that the counts are stable.  Fortunately today the patient has an [place] greater than 1.5 and therefore is no longer neutropenic.  #Leukopenia. stable, chronic.  --prior workup shows no nutritional abnormalities or other overt causes of the patient's leukopenia.  --WBC count today is stable from prior. [place] is fortunately >1.5 and patient has higher WBC count than previously noted in 03/2019 --patient had normal WBC as recently has 2017, making benign ethnic neutropenia considerably less likely. -- no clear indication for a bone marrow biopsy today.  -- continue to monitor WBC. RTC in 6 months to reassess or RTC sooner if new symptoms or decrease in counts is noted.   No orders of the defined types were placed in this  encounter.  All questions were answered. The patient knows to call the clinic with any problems, questions or concerns.  A total of more than 20 minutes were spent on this encounter and over half of that time was spent on counseling and coordination of care as outlined above.   Lorraine Peoples, MD Department of Hematology/Oncology [place] at [facility] Phone: [phone] Pager: [phone] Email: [email or web address]  07/24/2019 3:32 PM

## 2019-07-25 ENCOUNTER — Telehealth: Payer: Self-pay | Admitting: Hematology and Oncology

## 2019-07-25 NOTE — Telephone Encounter (Signed)
Scheduled per los. Called and left msg. Mailed printout  °

## 2019-08-12 ENCOUNTER — Ambulatory Visit: Payer: BC Managed Care – PPO | Admitting: Hematology and Oncology

## 2019-08-12 ENCOUNTER — Other Ambulatory Visit: Payer: BC Managed Care – PPO

## 2019-08-16 DIAGNOSIS — E250 Congenital adrenogenital disorders associated with enzyme deficiency: Secondary | ICD-10-CM | POA: Diagnosis not present

## 2019-08-16 DIAGNOSIS — I10 Essential (primary) hypertension: Secondary | ICD-10-CM | POA: Diagnosis not present

## 2019-09-02 DIAGNOSIS — Z1151 Encounter for screening for human papillomavirus (HPV): Secondary | ICD-10-CM | POA: Diagnosis not present

## 2019-09-02 DIAGNOSIS — Z6826 Body mass index (BMI) 26.0-26.9, adult: Secondary | ICD-10-CM | POA: Diagnosis not present

## 2019-09-02 DIAGNOSIS — Z1231 Encounter for screening mammogram for malignant neoplasm of breast: Secondary | ICD-10-CM | POA: Diagnosis not present

## 2019-09-02 DIAGNOSIS — Z01419 Encounter for gynecological examination (general) (routine) without abnormal findings: Secondary | ICD-10-CM | POA: Diagnosis not present

## 2019-11-13 DIAGNOSIS — H04123 Dry eye syndrome of bilateral lacrimal glands: Secondary | ICD-10-CM | POA: Diagnosis not present

## 2019-11-13 DIAGNOSIS — H40013 Open angle with borderline findings, low risk, bilateral: Secondary | ICD-10-CM | POA: Diagnosis not present

## 2020-01-02 DIAGNOSIS — I10 Essential (primary) hypertension: Secondary | ICD-10-CM | POA: Diagnosis not present

## 2020-01-02 DIAGNOSIS — Z0000 Encounter for general adult medical examination without abnormal findings: Secondary | ICD-10-CM | POA: Diagnosis not present

## 2020-01-02 DIAGNOSIS — Z131 Encounter for screening for diabetes mellitus: Secondary | ICD-10-CM | POA: Diagnosis not present

## 2020-01-02 DIAGNOSIS — Z23 Encounter for immunization: Secondary | ICD-10-CM | POA: Diagnosis not present

## 2020-01-02 DIAGNOSIS — Z13220 Encounter for screening for lipoid disorders: Secondary | ICD-10-CM | POA: Diagnosis not present

## 2020-01-22 ENCOUNTER — Other Ambulatory Visit: Payer: Self-pay | Admitting: Hematology and Oncology

## 2020-01-22 DIAGNOSIS — D709 Neutropenia, unspecified: Secondary | ICD-10-CM

## 2020-01-22 NOTE — Progress Notes (Signed)
[place] Telephone:(336) [phone]   Fax:(336) [phone]  PROGRESS NOTE  Patient Care Team: Pa, [place] as PCP - General (Family Medicine)  Hematological/Oncological History #Leukopenia, neutropenia 1) 04/07/2016: WBC 4.9, Hgb 12.9, Plt 353. MCV 81.1 2) 01/02/2019: WBC 3.0, Hgb 10.9, MCV 79.3, Plt 381. No Differential. Ferritin 5.5 3) 03/28/2019:WBC 2.6, Hgb 12.8, Plt 296, MCV 84.8. ANC 1.1 4) 05/13/2019: establish care with Dr. Lorenso Courier  Interval History:  Lorraine Rodriguez 44 y.o. female with medical history significant for unexplained leukopenia who presents for a follow up visit. The patient's last visit was on 07/24/2019. In the interim since the last visit she has had no medication changes, infections, or been on any antibiotic therapy.   On exam today Lorraine Rodriguez notes that she has been well since her last visit.  She denies having issues with fevers, chills, sweats, nausea, vomiting or diarrhea.  She knows she is not having infectious symptoms, though she does have an increase in allergies this time of year.  She reports overall she feels well and has no questions concerns or complaints today.  A full 10 point ROS is listed below.  MEDICAL HISTORY:  Past Medical History:  Diagnosis Date  . Allergy    hayfever  . Glaucoma    both eyes  . Hypertension     SURGICAL HISTORY: Past Surgical History:  Procedure Laterality Date  . CESAREAN SECTION  2010    SOCIAL HISTORY: Social History   Socioeconomic History  . Marital status: Single    Spouse name: Not on file  . Number of children: Not on file  . Years of education: Not on file  . Highest education level: Not on file  Occupational History  . Not on file  Tobacco Use  . Smoking status: Never Smoker  . Smokeless tobacco: Never Used  Substance and Sexual Activity  . Alcohol use: No    Alcohol/week: 0.0 standard drinks  . Drug use: No  . Sexual activity: Not on file  Other  Topics Concern  . Not on file  Social History Narrative  . Not on file   Social Determinants of Health   Financial Resource Strain:   . Difficulty of Paying Living Expenses: Not on file  Food Insecurity:   . Worried About Charity fundraiser in the Last Year: Not on file  . Ran Out of Food in the Last Year: Not on file  Transportation Needs:   . Lack of Transportation (Medical): Not on file  . Lack of Transportation (Non-Medical): Not on file  Physical Activity:   . Days of Exercise per Week: Not on file  . Minutes of Exercise per Session: Not on file  Stress:   . Feeling of Stress : Not on file  Social Connections:   . Frequency of Communication with Friends and Family: Not on file  . Frequency of Social Gatherings with Friends and Family: Not on file  . Attends Religious Services: Not on file  . Active Member of Clubs or Organizations: Not on file  . Attends Archivist Meetings: Not on file  . Marital Status: Not on file  Intimate Partner Violence:   . Fear of Current or Ex-Partner: Not on file  . Emotionally Abused: Not on file  . Physically Abused: Not on file  . Sexually Abused: Not on file    FAMILY HISTORY: Family History  Problem Relation Age of Onset  . Diabetes Mother   . Hypertension  Mother   . Hypertension Father   . Glaucoma Maternal Grandfather   . Heart disease Maternal Grandfather        congential heart failure  . Kidney failure Paternal Grandmother   . Alzheimer's disease Paternal Grandfather     ALLERGIES:  is allergic to peanuts [peanut oil].  MEDICATIONS:  Current Outpatient Medications  Medication Sig Dispense Refill  . amitriptyline (ELAVIL) 10 MG tablet Take by mouth.    Marland Kitchen amLODipine (NORVASC) 10 MG tablet Take by mouth.    . montelukast (SINGULAIR) 10 MG tablet Take by mouth.    Marland Kitchen ibuprofen (ADVIL,MOTRIN) 600 MG tablet Take 1 tablet (600 mg total) by mouth every 8 (eight) hours as needed. 30 tablet 0  .  multivitamin-iron-minerals-folic acid (CENTRUM) chewable tablet Chew 1 tablet by mouth daily.     No current facility-administered medications for this visit.    REVIEW OF SYSTEMS:   Constitutional: ( - ) fevers, ( - )  chills , ( - ) night sweats Eyes: ( - ) blurriness of vision, ( - ) double vision, ( - ) watery eyes Ears, nose, mouth, throat, and face: ( - ) mucositis, ( - ) sore throat Respiratory: ( - ) cough, ( - ) dyspnea, ( - ) wheezes Cardiovascular: ( - ) palpitation, ( - ) chest discomfort, ( - ) lower extremity swelling Gastrointestinal:  ( - ) nausea, ( - ) heartburn, ( - ) change in bowel habits Skin: ( - ) abnormal skin rashes Lymphatics: ( - ) new lymphadenopathy, ( - ) easy bruising Neurological: ( - ) numbness, ( - ) tingling, ( - ) new weaknesses Behavioral/Psych: ( - ) mood change, ( - ) new changes  All other systems were reviewed with the patient and are negative.  PHYSICAL EXAMINATION: ECOG PERFORMANCE STATUS: 0 - Asymptomatic  Vitals:   01/23/20 1448  BP: (!) 124/95  Pulse: 72  Resp: 18  Temp: 97.8 F (36.6 C)  SpO2: 100%   Filed Weights   01/23/20 1448  Weight: 162 lb (73.5 kg)    GENERAL: well appearing middle aged [place] American female, alert, no distress and comfortable SKIN: skin color, texture, turgor are normal, no rashes or significant lesions EYES: conjunctiva are pink and non-injected, sclera clear LUNGS: clear to auscultation and percussion with normal breathing effort HEART: regular rate & rhythm and no murmurs and no lower extremity edema Musculoskeletal: no cyanosis of digits and no clubbing  PSYCH: alert & oriented x 3, fluent speech NEURO: no focal motor/sensory deficits  LABORATORY DATA:  I have reviewed the data as listed CBC Latest Ref Rng & Units 01/23/2020 07/24/2019 05/13/2019  WBC 4.0 - 10.5 K/uL 4.6 3.5(L) 3.6(L)  Hemoglobin 12.0 - 15.0 g/dL 12.6 12.7 13.8  Hematocrit 36 - 46 % 39.2 40.2 43.1  Platelets 150 - 400 K/uL  230 262 252    CMP Latest Ref Rng & Units 01/23/2020 07/24/2019 05/13/2019  Glucose 70 - 99 mg/dL 84 75 74  BUN 6 - 20 mg/dL '18 17 15  ' Creatinine 0.44 - 1.00 mg/dL 1.00 0.87 1.01(H)  Sodium 135 - 145 mmol/L 138 141 137  Potassium 3.5 - 5.1 mmol/L 4.2 3.7 4.2  Chloride 98 - 111 mmol/L 104 107 106  CO2 22 - 32 mmol/L '28 24 23  ' Calcium 8.9 - 10.3 mg/dL 9.0 8.9 8.9  Total Protein 6.5 - 8.1 g/dL 7.4 7.7 8.0  Total Bilirubin 0.3 - 1.2 mg/dL <0.2(L) 0.2(L) 0.4  Alkaline Phos  38 - 126 U/L 49 50 52  AST 15 - 41 U/L '16 19 21  ' ALT 0 - 44 U/L '11 11 14    ' RADIOGRAPHIC STUDIES: No results found.  ASSESSMENT & PLAN Lorraine Rodriguez 44 y.o. female with medical history significant for leukopenia who presents for a follow up visit.  After review the labs and discussion with the patient her findings are consistent with a stable WBC and normal ANC.  Fortunately the patient has not had any major changes in her health since her last visit.  At this time there are 2 distinct possibilities which are that the patient runs a low to low normal white blood cell count versus the patient had a transient abnormality which caused her leukopenia.  Given that she is returned to normal levels of ANC and her leukopenia have resolved it would be reasonable to have this patient discharged from our clinic and have her return on an as-needed basis.  Would recommend PCP follow routine CBCs and have her be referred in the event any new abnormalities were to be noted.  #Leukopenia. stable, chronic.  --prior workup shows no nutritional abnormalities or other overt causes of the patient's leukopenia.  --WBC count today is increased from prior. [place] is fortunately >1.5 and patient has higher WBC count than previously noted in 03/2019 --patient had normal WBC as recently has 2017, making benign ethnic neutropenia less likely. Possible transient etiology vs low-low normal WBC baseline.  -- no clear indication for a bone marrow biopsy --  continue to monitor WBC with PCP. RTC PRN, though please re-refer if new symptoms or decrease in counts is noted.   No orders of the defined types were placed in this encounter.  All questions were answered. The patient knows to call the clinic with any problems, questions or concerns.  A total of more than 30 minutes were spent on this encounter and over half of that time was spent on counseling and coordination of care as outlined above.   Ledell Peoples, MD Department of Hematology/Oncology [place] at [facility] Phone: [phone] Pager: [phone] Email: [email or web address]  01/24/2020 10:55 AM

## 2020-01-23 ENCOUNTER — Other Ambulatory Visit: Payer: Self-pay

## 2020-01-23 ENCOUNTER — Inpatient Hospital Stay: Payer: BC Managed Care – PPO | Attending: Hematology and Oncology | Admitting: Hematology and Oncology

## 2020-01-23 ENCOUNTER — Inpatient Hospital Stay: Payer: BC Managed Care – PPO

## 2020-01-23 VITALS — BP 124/95 | HR 72 | Temp 97.8°F | Resp 18 | Ht 63.5 in | Wt 162.0 lb

## 2020-01-23 DIAGNOSIS — Z83511 Family history of glaucoma: Secondary | ICD-10-CM | POA: Diagnosis not present

## 2020-01-23 DIAGNOSIS — I10 Essential (primary) hypertension: Secondary | ICD-10-CM | POA: Diagnosis not present

## 2020-01-23 DIAGNOSIS — D709 Neutropenia, unspecified: Secondary | ICD-10-CM

## 2020-01-23 DIAGNOSIS — Z8249 Family history of ischemic heart disease and other diseases of the circulatory system: Secondary | ICD-10-CM | POA: Insufficient documentation

## 2020-01-23 DIAGNOSIS — Z841 Family history of disorders of kidney and ureter: Secondary | ICD-10-CM | POA: Diagnosis not present

## 2020-01-23 DIAGNOSIS — Z79899 Other long term (current) drug therapy: Secondary | ICD-10-CM | POA: Diagnosis not present

## 2020-01-23 DIAGNOSIS — Z818 Family history of other mental and behavioral disorders: Secondary | ICD-10-CM | POA: Diagnosis not present

## 2020-01-23 DIAGNOSIS — Z833 Family history of diabetes mellitus: Secondary | ICD-10-CM | POA: Diagnosis not present

## 2020-01-23 DIAGNOSIS — D72819 Decreased white blood cell count, unspecified: Secondary | ICD-10-CM | POA: Diagnosis not present

## 2020-01-23 LAB — CBC WITH DIFFERENTIAL (CANCER CENTER ONLY)
Abs Immature Granulocytes: 0 10*3/uL (ref 0.00–0.07)
Basophils Absolute: 0 10*3/uL (ref 0.0–0.1)
Basophils Relative: 1 %
Eosinophils Absolute: 0.1 10*3/uL (ref 0.0–0.5)
Eosinophils Relative: 3 %
HCT: 39.2 % (ref 36.0–46.0)
Hemoglobin: 12.6 g/dL (ref 12.0–15.0)
Immature Granulocytes: 0 %
Lymphocytes Relative: 29 %
Lymphs Abs: 1.3 10*3/uL (ref 0.7–4.0)
MCH: 28.1 pg (ref 26.0–34.0)
MCHC: 32.1 g/dL (ref 30.0–36.0)
MCV: 87.5 fL (ref 80.0–100.0)
Monocytes Absolute: 0.6 10*3/uL (ref 0.1–1.0)
Monocytes Relative: 13 %
Neutro Abs: 2.5 10*3/uL (ref 1.7–7.7)
Neutrophils Relative %: 54 %
Platelet Count: 230 10*3/uL (ref 150–400)
RBC: 4.48 MIL/uL (ref 3.87–5.11)
RDW: 13.8 % (ref 11.5–15.5)
WBC Count: 4.6 10*3/uL (ref 4.0–10.5)
nRBC: 0 % (ref 0.0–0.2)

## 2020-01-23 LAB — CMP (CANCER CENTER ONLY)
ALT: 11 U/L (ref 0–44)
AST: 16 U/L (ref 15–41)
Albumin: 3.6 g/dL (ref 3.5–5.0)
Alkaline Phosphatase: 49 U/L (ref 38–126)
Anion gap: 6 (ref 5–15)
BUN: 18 mg/dL (ref 6–20)
CO2: 28 mmol/L (ref 22–32)
Calcium: 9 mg/dL (ref 8.9–10.3)
Chloride: 104 mmol/L (ref 98–111)
Creatinine: 1 mg/dL (ref 0.44–1.00)
GFR, Est AFR Am: >60 mL/min (ref >60)
GFR, Estimated: >60 mL/min (ref >60)
Glucose, Bld: 84 mg/dL (ref 70–99)
Potassium: 4.2 mmol/L (ref 3.5–5.1)
Sodium: 138 mmol/L (ref 135–145)
Total Bilirubin: <0.2 mg/dL — ABNORMAL LOW (ref 0.3–1.2)
Total Protein: 7.4 g/dL (ref 6.5–8.1)

## 2020-02-01 DIAGNOSIS — Z111 Encounter for screening for respiratory tuberculosis: Secondary | ICD-10-CM | POA: Diagnosis not present

## 2020-02-03 DIAGNOSIS — Z111 Encounter for screening for respiratory tuberculosis: Secondary | ICD-10-CM | POA: Diagnosis not present

## 2020-08-12 DIAGNOSIS — I10 Essential (primary) hypertension: Secondary | ICD-10-CM | POA: Diagnosis not present

## 2020-08-12 DIAGNOSIS — E250 Congenital adrenogenital disorders associated with enzyme deficiency: Secondary | ICD-10-CM | POA: Diagnosis not present

## 2020-09-28 DIAGNOSIS — Z1231 Encounter for screening mammogram for malignant neoplasm of breast: Secondary | ICD-10-CM | POA: Diagnosis not present

## 2020-09-28 DIAGNOSIS — Z01419 Encounter for gynecological examination (general) (routine) without abnormal findings: Secondary | ICD-10-CM | POA: Diagnosis not present

## 2020-09-28 DIAGNOSIS — Z6827 Body mass index (BMI) 27.0-27.9, adult: Secondary | ICD-10-CM | POA: Diagnosis not present

## 2020-11-16 DIAGNOSIS — H04123 Dry eye syndrome of bilateral lacrimal glands: Secondary | ICD-10-CM | POA: Diagnosis not present

## 2020-11-16 DIAGNOSIS — H40013 Open angle with borderline findings, low risk, bilateral: Secondary | ICD-10-CM | POA: Diagnosis not present

## 2020-11-16 DIAGNOSIS — H10413 Chronic giant papillary conjunctivitis, bilateral: Secondary | ICD-10-CM | POA: Diagnosis not present

## 2021-01-15 DIAGNOSIS — Z111 Encounter for screening for respiratory tuberculosis: Secondary | ICD-10-CM | POA: Diagnosis not present

## 2021-01-17 DIAGNOSIS — Z111 Encounter for screening for respiratory tuberculosis: Secondary | ICD-10-CM | POA: Diagnosis not present

## 2021-01-18 DIAGNOSIS — J4520 Mild intermittent asthma, uncomplicated: Secondary | ICD-10-CM | POA: Diagnosis not present

## 2021-01-18 DIAGNOSIS — I10 Essential (primary) hypertension: Secondary | ICD-10-CM | POA: Diagnosis not present

## 2021-01-18 DIAGNOSIS — Z0000 Encounter for general adult medical examination without abnormal findings: Secondary | ICD-10-CM | POA: Diagnosis not present

## 2021-01-18 DIAGNOSIS — D509 Iron deficiency anemia, unspecified: Secondary | ICD-10-CM | POA: Diagnosis not present

## 2021-01-18 DIAGNOSIS — G43009 Migraine without aura, not intractable, without status migrainosus: Secondary | ICD-10-CM | POA: Diagnosis not present

## 2021-01-18 DIAGNOSIS — R7309 Other abnormal glucose: Secondary | ICD-10-CM | POA: Diagnosis not present

## 2021-07-09 DIAGNOSIS — R140 Abdominal distension (gaseous): Secondary | ICD-10-CM | POA: Diagnosis not present

## 2021-08-02 DIAGNOSIS — Z1211 Encounter for screening for malignant neoplasm of colon: Secondary | ICD-10-CM | POA: Diagnosis not present

## 2021-08-05 DIAGNOSIS — E250 Congenital adrenogenital disorders associated with enzyme deficiency: Secondary | ICD-10-CM | POA: Diagnosis not present

## 2021-08-12 DIAGNOSIS — E250 Congenital adrenogenital disorders associated with enzyme deficiency: Secondary | ICD-10-CM | POA: Diagnosis not present

## 2021-08-12 DIAGNOSIS — I10 Essential (primary) hypertension: Secondary | ICD-10-CM | POA: Diagnosis not present

## 2021-09-14 DIAGNOSIS — I10 Essential (primary) hypertension: Secondary | ICD-10-CM | POA: Diagnosis not present

## 2021-09-14 DIAGNOSIS — Z713 Dietary counseling and surveillance: Secondary | ICD-10-CM | POA: Diagnosis not present

## 2021-10-04 DIAGNOSIS — M791 Myalgia, unspecified site: Secondary | ICD-10-CM | POA: Diagnosis not present

## 2021-10-07 DIAGNOSIS — Z01419 Encounter for gynecological examination (general) (routine) without abnormal findings: Secondary | ICD-10-CM | POA: Diagnosis not present

## 2021-10-07 DIAGNOSIS — Z6828 Body mass index (BMI) 28.0-28.9, adult: Secondary | ICD-10-CM | POA: Diagnosis not present

## 2021-10-07 DIAGNOSIS — Z1231 Encounter for screening mammogram for malignant neoplasm of breast: Secondary | ICD-10-CM | POA: Diagnosis not present

## 2021-10-25 DIAGNOSIS — N920 Excessive and frequent menstruation with regular cycle: Secondary | ICD-10-CM | POA: Diagnosis not present

## 2021-10-25 DIAGNOSIS — N946 Dysmenorrhea, unspecified: Secondary | ICD-10-CM | POA: Diagnosis not present

## 2021-11-17 DIAGNOSIS — H1045 Other chronic allergic conjunctivitis: Secondary | ICD-10-CM | POA: Diagnosis not present

## 2021-11-17 DIAGNOSIS — H04123 Dry eye syndrome of bilateral lacrimal glands: Secondary | ICD-10-CM | POA: Diagnosis not present

## 2021-11-17 DIAGNOSIS — H40013 Open angle with borderline findings, low risk, bilateral: Secondary | ICD-10-CM | POA: Diagnosis not present

## 2021-11-29 DIAGNOSIS — N946 Dysmenorrhea, unspecified: Secondary | ICD-10-CM | POA: Diagnosis not present

## 2021-11-29 DIAGNOSIS — Z3202 Encounter for pregnancy test, result negative: Secondary | ICD-10-CM | POA: Diagnosis not present

## 2021-11-29 DIAGNOSIS — N920 Excessive and frequent menstruation with regular cycle: Secondary | ICD-10-CM | POA: Diagnosis not present

## 2021-12-22 DIAGNOSIS — Z3202 Encounter for pregnancy test, result negative: Secondary | ICD-10-CM | POA: Diagnosis not present

## 2021-12-22 DIAGNOSIS — R9389 Abnormal findings on diagnostic imaging of other specified body structures: Secondary | ICD-10-CM | POA: Diagnosis not present

## 2022-01-07 DIAGNOSIS — N946 Dysmenorrhea, unspecified: Secondary | ICD-10-CM | POA: Diagnosis not present

## 2022-01-07 DIAGNOSIS — R9389 Abnormal findings on diagnostic imaging of other specified body structures: Secondary | ICD-10-CM | POA: Diagnosis not present

## 2022-01-27 DIAGNOSIS — Z0000 Encounter for general adult medical examination without abnormal findings: Secondary | ICD-10-CM | POA: Diagnosis not present

## 2022-01-27 DIAGNOSIS — I10 Essential (primary) hypertension: Secondary | ICD-10-CM | POA: Diagnosis not present

## 2022-01-27 DIAGNOSIS — F411 Generalized anxiety disorder: Secondary | ICD-10-CM | POA: Diagnosis not present

## 2022-01-27 DIAGNOSIS — G43009 Migraine without aura, not intractable, without status migrainosus: Secondary | ICD-10-CM | POA: Diagnosis not present

## 2022-01-27 DIAGNOSIS — D509 Iron deficiency anemia, unspecified: Secondary | ICD-10-CM | POA: Diagnosis not present

## 2022-01-27 DIAGNOSIS — R7303 Prediabetes: Secondary | ICD-10-CM | POA: Diagnosis not present

## 2022-04-26 DIAGNOSIS — F411 Generalized anxiety disorder: Secondary | ICD-10-CM | POA: Diagnosis not present

## 2022-04-26 DIAGNOSIS — E611 Iron deficiency: Secondary | ICD-10-CM | POA: Diagnosis not present

## 2022-04-26 DIAGNOSIS — I10 Essential (primary) hypertension: Secondary | ICD-10-CM | POA: Diagnosis not present

## 2022-07-11 DIAGNOSIS — J029 Acute pharyngitis, unspecified: Secondary | ICD-10-CM | POA: Diagnosis not present

## 2022-08-01 DIAGNOSIS — Z23 Encounter for immunization: Secondary | ICD-10-CM | POA: Diagnosis not present

## 2022-08-31 DIAGNOSIS — Z23 Encounter for immunization: Secondary | ICD-10-CM | POA: Diagnosis not present

## 2022-11-21 DIAGNOSIS — H524 Presbyopia: Secondary | ICD-10-CM | POA: Diagnosis not present

## 2022-11-21 DIAGNOSIS — H40013 Open angle with borderline findings, low risk, bilateral: Secondary | ICD-10-CM | POA: Diagnosis not present

## 2022-11-21 DIAGNOSIS — H04123 Dry eye syndrome of bilateral lacrimal glands: Secondary | ICD-10-CM | POA: Diagnosis not present

## 2022-11-21 DIAGNOSIS — H1045 Other chronic allergic conjunctivitis: Secondary | ICD-10-CM | POA: Diagnosis not present

## 2022-12-17 DIAGNOSIS — Z111 Encounter for screening for respiratory tuberculosis: Secondary | ICD-10-CM | POA: Diagnosis not present

## 2022-12-19 DIAGNOSIS — Z111 Encounter for screening for respiratory tuberculosis: Secondary | ICD-10-CM | POA: Diagnosis not present

## 2023-01-30 DIAGNOSIS — G43009 Migraine without aura, not intractable, without status migrainosus: Secondary | ICD-10-CM | POA: Diagnosis not present

## 2023-01-30 DIAGNOSIS — J029 Acute pharyngitis, unspecified: Secondary | ICD-10-CM | POA: Diagnosis not present

## 2023-01-30 DIAGNOSIS — R7303 Prediabetes: Secondary | ICD-10-CM | POA: Diagnosis not present

## 2023-01-30 DIAGNOSIS — F411 Generalized anxiety disorder: Secondary | ICD-10-CM | POA: Diagnosis not present

## 2023-01-30 DIAGNOSIS — I10 Essential (primary) hypertension: Secondary | ICD-10-CM | POA: Diagnosis not present

## 2023-01-30 DIAGNOSIS — Z0000 Encounter for general adult medical examination without abnormal findings: Secondary | ICD-10-CM | POA: Diagnosis not present

## 2023-06-11 DIAGNOSIS — L700 Acne vulgaris: Secondary | ICD-10-CM | POA: Diagnosis not present

## 2023-09-25 DIAGNOSIS — F411 Generalized anxiety disorder: Secondary | ICD-10-CM | POA: Diagnosis not present

## 2023-09-25 DIAGNOSIS — I10 Essential (primary) hypertension: Secondary | ICD-10-CM | POA: Diagnosis not present

## 2023-09-25 DIAGNOSIS — G43009 Migraine without aura, not intractable, without status migrainosus: Secondary | ICD-10-CM | POA: Diagnosis not present

## 2023-10-27 DIAGNOSIS — L810 Postinflammatory hyperpigmentation: Secondary | ICD-10-CM | POA: Diagnosis not present

## 2023-11-07 DIAGNOSIS — Z111 Encounter for screening for respiratory tuberculosis: Secondary | ICD-10-CM | POA: Diagnosis not present

## 2023-11-09 DIAGNOSIS — Z111 Encounter for screening for respiratory tuberculosis: Secondary | ICD-10-CM | POA: Diagnosis not present

## 2023-11-14 DIAGNOSIS — Z124 Encounter for screening for malignant neoplasm of cervix: Secondary | ICD-10-CM | POA: Diagnosis not present

## 2023-11-14 DIAGNOSIS — Z1231 Encounter for screening mammogram for malignant neoplasm of breast: Secondary | ICD-10-CM | POA: Diagnosis not present

## 2023-11-14 DIAGNOSIS — Z01419 Encounter for gynecological examination (general) (routine) without abnormal findings: Secondary | ICD-10-CM | POA: Diagnosis not present

## 2023-11-14 DIAGNOSIS — Z1331 Encounter for screening for depression: Secondary | ICD-10-CM | POA: Diagnosis not present

## 2024-01-24 DIAGNOSIS — L810 Postinflammatory hyperpigmentation: Secondary | ICD-10-CM | POA: Diagnosis not present

## 2024-02-22 DIAGNOSIS — G43009 Migraine without aura, not intractable, without status migrainosus: Secondary | ICD-10-CM | POA: Diagnosis not present

## 2024-02-22 DIAGNOSIS — F411 Generalized anxiety disorder: Secondary | ICD-10-CM | POA: Diagnosis not present

## 2024-02-22 DIAGNOSIS — I10 Essential (primary) hypertension: Secondary | ICD-10-CM | POA: Diagnosis not present

## 2024-02-22 DIAGNOSIS — Z131 Encounter for screening for diabetes mellitus: Secondary | ICD-10-CM | POA: Diagnosis not present

## 2024-02-22 DIAGNOSIS — Z0000 Encounter for general adult medical examination without abnormal findings: Secondary | ICD-10-CM | POA: Diagnosis not present

## 2024-03-20 DIAGNOSIS — L731 Pseudofolliculitis barbae: Secondary | ICD-10-CM | POA: Diagnosis not present
